# Patient Record
Sex: Female | Born: 1945 | Race: White | Hispanic: No | Marital: Married | Smoking: Never smoker
Health system: Southern US, Community
[De-identification: ages and names within clinical notes are randomized; demographics above are authoritative.]

## PROBLEM LIST (undated history)

## (undated) DIAGNOSIS — G90A Postural orthostatic tachycardia syndrome (POTS): Secondary | ICD-10-CM

## (undated) DIAGNOSIS — K648 Other hemorrhoids: Secondary | ICD-10-CM

## (undated) DIAGNOSIS — R000 Tachycardia, unspecified: Secondary | ICD-10-CM

## (undated) DIAGNOSIS — G548 Other nerve root and plexus disorders: Secondary | ICD-10-CM

## (undated) DIAGNOSIS — M797 Fibromyalgia: Secondary | ICD-10-CM

## (undated) DIAGNOSIS — K219 Gastro-esophageal reflux disease without esophagitis: Secondary | ICD-10-CM

## (undated) DIAGNOSIS — K5790 Diverticulosis of intestine, part unspecified, without perforation or abscess without bleeding: Secondary | ICD-10-CM

## (undated) DIAGNOSIS — D0510 Intraductal carcinoma in situ of unspecified breast: Secondary | ICD-10-CM

## (undated) DIAGNOSIS — I498 Other specified cardiac arrhythmias: Secondary | ICD-10-CM

## (undated) DIAGNOSIS — C801 Malignant (primary) neoplasm, unspecified: Secondary | ICD-10-CM

## (undated) DIAGNOSIS — D126 Benign neoplasm of colon, unspecified: Secondary | ICD-10-CM

## (undated) DIAGNOSIS — E785 Hyperlipidemia, unspecified: Secondary | ICD-10-CM

## (undated) DIAGNOSIS — I951 Orthostatic hypotension: Secondary | ICD-10-CM

## (undated) DIAGNOSIS — Z9889 Other specified postprocedural states: Secondary | ICD-10-CM

## (undated) DIAGNOSIS — R112 Nausea with vomiting, unspecified: Secondary | ICD-10-CM

## (undated) DIAGNOSIS — E079 Disorder of thyroid, unspecified: Secondary | ICD-10-CM

## (undated) DIAGNOSIS — G96191 Perineural cyst: Secondary | ICD-10-CM

## (undated) DIAGNOSIS — R599 Enlarged lymph nodes, unspecified: Secondary | ICD-10-CM

## (undated) HISTORY — DX: Intraductal carcinoma in situ of unspecified breast: D05.10

## (undated) HISTORY — DX: Perineural cyst: G96.191

## (undated) HISTORY — DX: Malignant (primary) neoplasm, unspecified: C80.1

## (undated) HISTORY — DX: Enlarged lymph nodes, unspecified: R59.9

## (undated) HISTORY — DX: Benign neoplasm of colon, unspecified: D12.6

## (undated) HISTORY — DX: Hyperlipidemia, unspecified: E78.5

## (undated) HISTORY — DX: Disorder of thyroid, unspecified: E07.9

## (undated) HISTORY — DX: Fibromyalgia: M79.7

## (undated) HISTORY — DX: Gastro-esophageal reflux disease without esophagitis: K21.9

## (undated) HISTORY — DX: Other nerve root and plexus disorders: G54.8

## (undated) HISTORY — PX: BREAST SURGERY: SHX581

## (undated) HISTORY — DX: Diverticulosis of intestine, part unspecified, without perforation or abscess without bleeding: K57.90

## (undated) HISTORY — DX: Other hemorrhoids: K64.8

---

## 1972-07-01 HISTORY — PX: ABDOMINAL HYSTERECTOMY: SHX81

## 1997-07-01 HISTORY — PX: BILATERAL OOPHORECTOMY: SHX1221

## 1998-05-31 HISTORY — PX: SALPINGECTOMY: SHX328

## 1999-08-07 ENCOUNTER — Ambulatory Visit (HOSPITAL_COMMUNITY): Admission: RE | Admit: 1999-08-07 | Discharge: 1999-08-07 | Payer: Self-pay | Admitting: Gastroenterology

## 2000-07-09 ENCOUNTER — Ambulatory Visit (HOSPITAL_COMMUNITY): Admission: RE | Admit: 2000-07-09 | Discharge: 2000-07-09 | Payer: Self-pay | Admitting: General Surgery

## 2000-07-09 ENCOUNTER — Encounter (INDEPENDENT_AMBULATORY_CARE_PROVIDER_SITE_OTHER): Payer: Self-pay

## 2000-07-23 ENCOUNTER — Other Ambulatory Visit: Admission: RE | Admit: 2000-07-23 | Discharge: 2000-07-23 | Payer: Self-pay | Admitting: Obstetrics and Gynecology

## 2001-08-07 ENCOUNTER — Other Ambulatory Visit: Admission: RE | Admit: 2001-08-07 | Discharge: 2001-08-07 | Payer: Self-pay | Admitting: Obstetrics and Gynecology

## 2001-09-23 ENCOUNTER — Ambulatory Visit (HOSPITAL_COMMUNITY): Admission: RE | Admit: 2001-09-23 | Discharge: 2001-09-23 | Payer: Self-pay | Admitting: Internal Medicine

## 2001-09-23 ENCOUNTER — Encounter: Payer: Self-pay | Admitting: Internal Medicine

## 2003-01-07 ENCOUNTER — Other Ambulatory Visit: Admission: RE | Admit: 2003-01-07 | Discharge: 2003-01-07 | Payer: Self-pay | Admitting: Obstetrics and Gynecology

## 2004-02-16 ENCOUNTER — Other Ambulatory Visit: Admission: RE | Admit: 2004-02-16 | Discharge: 2004-02-16 | Payer: Self-pay | Admitting: Obstetrics and Gynecology

## 2005-02-27 ENCOUNTER — Ambulatory Visit: Payer: Self-pay | Admitting: Internal Medicine

## 2005-03-18 ENCOUNTER — Ambulatory Visit: Payer: Self-pay | Admitting: Internal Medicine

## 2005-03-18 ENCOUNTER — Ambulatory Visit (HOSPITAL_COMMUNITY): Admission: RE | Admit: 2005-03-18 | Discharge: 2005-03-18 | Payer: Self-pay | Admitting: Internal Medicine

## 2005-03-18 ENCOUNTER — Encounter (INDEPENDENT_AMBULATORY_CARE_PROVIDER_SITE_OTHER): Payer: Self-pay | Admitting: Specialist

## 2005-05-29 ENCOUNTER — Other Ambulatory Visit: Admission: RE | Admit: 2005-05-29 | Discharge: 2005-05-29 | Payer: Self-pay | Admitting: Obstetrics and Gynecology

## 2005-07-24 ENCOUNTER — Encounter (HOSPITAL_COMMUNITY): Admission: RE | Admit: 2005-07-24 | Discharge: 2005-10-08 | Payer: Self-pay | Admitting: Internal Medicine

## 2005-07-25 ENCOUNTER — Ambulatory Visit (HOSPITAL_COMMUNITY): Admission: RE | Admit: 2005-07-25 | Discharge: 2005-07-25 | Payer: Self-pay | Admitting: Internal Medicine

## 2005-08-21 ENCOUNTER — Encounter: Payer: Self-pay | Admitting: *Deleted

## 2005-09-02 ENCOUNTER — Encounter: Payer: Self-pay | Admitting: Internal Medicine

## 2005-09-02 ENCOUNTER — Encounter: Payer: Self-pay | Admitting: *Deleted

## 2005-09-30 ENCOUNTER — Encounter (HOSPITAL_COMMUNITY): Admission: RE | Admit: 2005-09-30 | Discharge: 2005-12-29 | Payer: Self-pay | Admitting: Internal Medicine

## 2005-10-04 ENCOUNTER — Ambulatory Visit (HOSPITAL_COMMUNITY): Admission: RE | Admit: 2005-10-04 | Discharge: 2005-10-04 | Payer: Self-pay | Admitting: Internal Medicine

## 2005-12-05 ENCOUNTER — Ambulatory Visit (HOSPITAL_COMMUNITY): Admission: RE | Admit: 2005-12-05 | Discharge: 2005-12-05 | Payer: Self-pay | Admitting: Internal Medicine

## 2006-02-12 ENCOUNTER — Encounter: Admission: RE | Admit: 2006-02-12 | Discharge: 2006-02-12 | Payer: Self-pay | Admitting: Otolaryngology

## 2006-07-18 ENCOUNTER — Encounter: Admission: RE | Admit: 2006-07-18 | Discharge: 2006-07-18 | Payer: Self-pay | Admitting: General Surgery

## 2007-02-27 ENCOUNTER — Ambulatory Visit (HOSPITAL_COMMUNITY): Admission: RE | Admit: 2007-02-27 | Discharge: 2007-02-27 | Payer: Self-pay | Admitting: Internal Medicine

## 2007-03-18 ENCOUNTER — Ambulatory Visit: Payer: Self-pay | Admitting: Internal Medicine

## 2007-03-26 ENCOUNTER — Ambulatory Visit: Payer: Self-pay

## 2007-06-03 ENCOUNTER — Ambulatory Visit: Payer: Self-pay | Admitting: Internal Medicine

## 2007-08-07 ENCOUNTER — Encounter: Payer: Self-pay | Admitting: Internal Medicine

## 2007-12-18 ENCOUNTER — Encounter: Admission: RE | Admit: 2007-12-18 | Discharge: 2007-12-18 | Payer: Self-pay | Admitting: Internal Medicine

## 2007-12-27 IMAGING — US US SOFT TISSUE HEAD/NECK
1 series · 14 of 25 positions shown · non-contrast
Comparison: none

CLINICAL DATA: Multinodular goiter

[Series 1: unknown · 0.07mm/px · 14 of 50 slices shown]
[im 1/50]
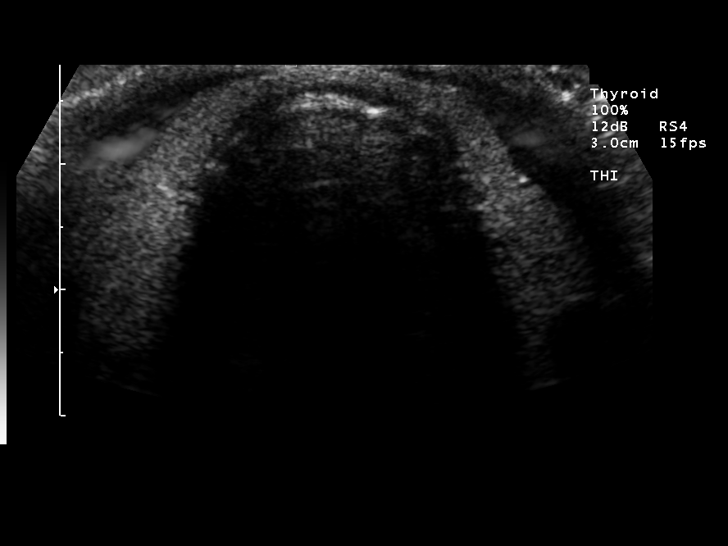
[im 5/50]
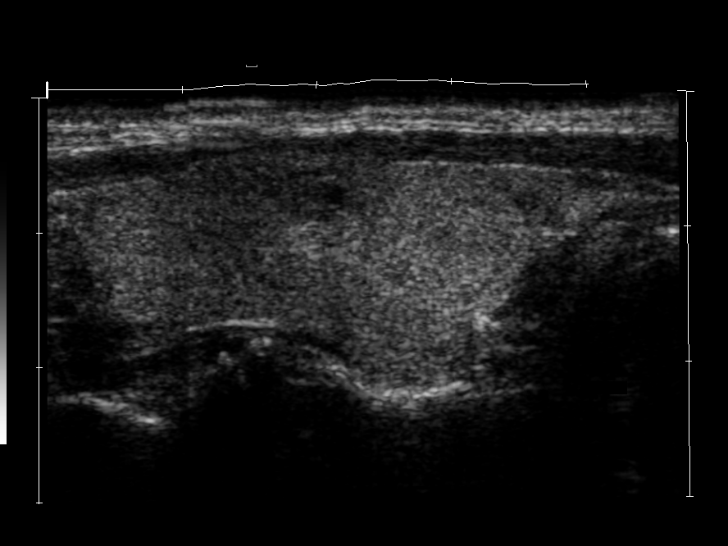
[im 9/50]
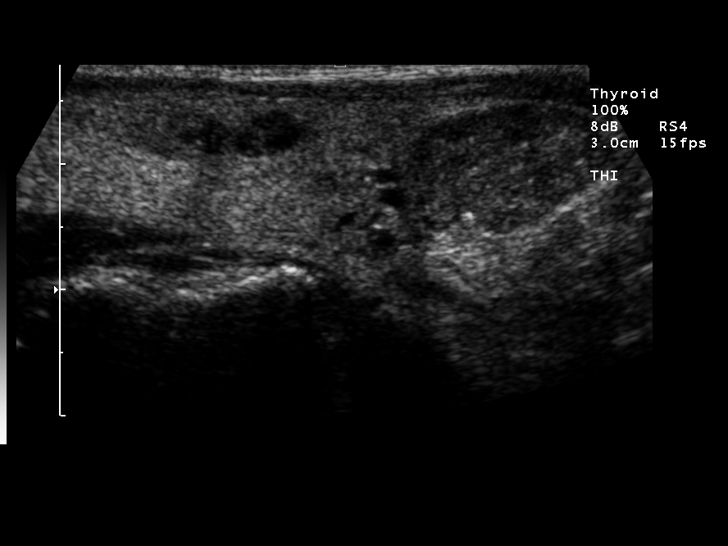
[im 13/50]
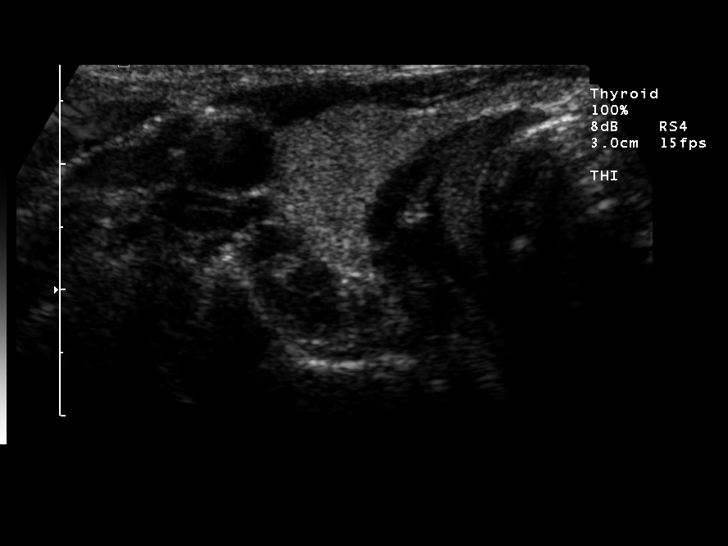
[im 17/50]
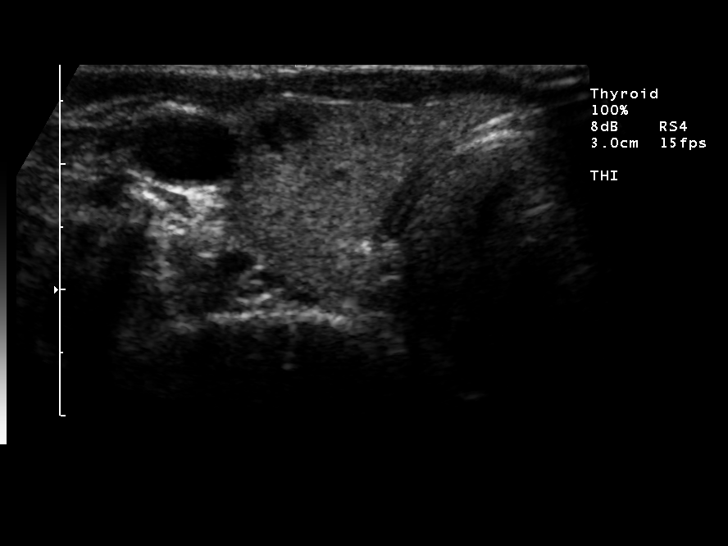
[im 19/50]
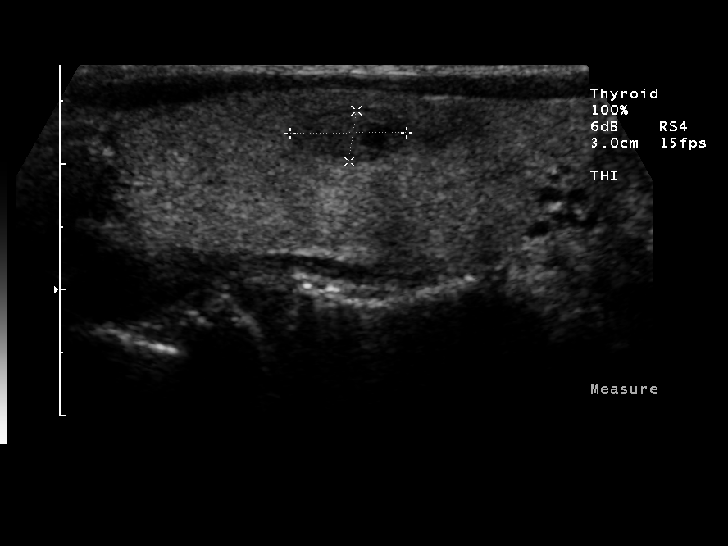
[im 23/50]
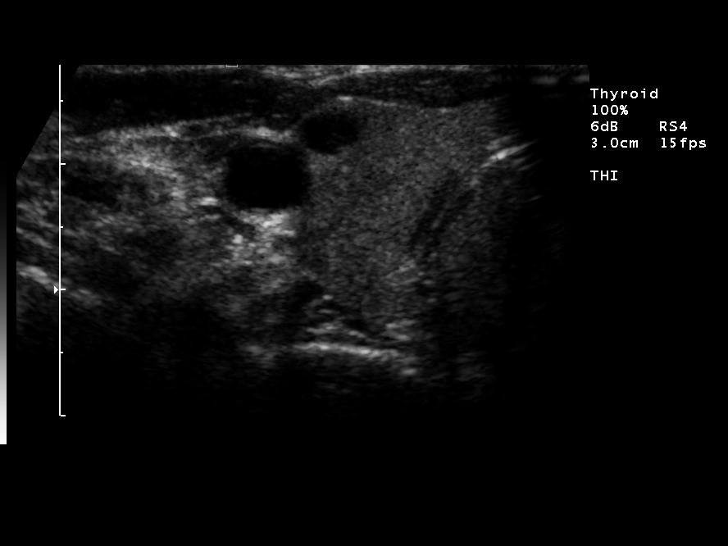
[im 27/50]
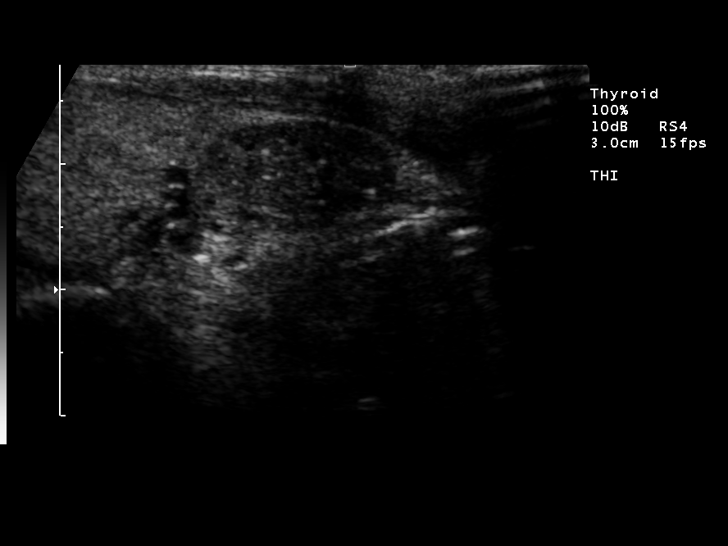
[im 31/50]
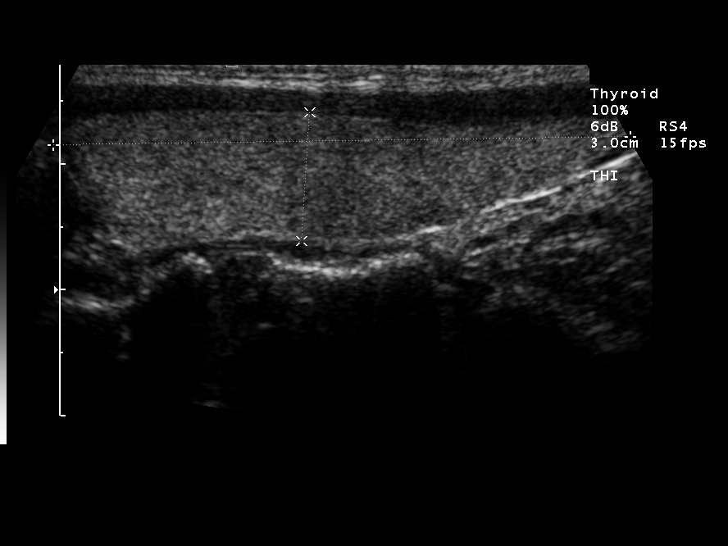
[im 33/50]
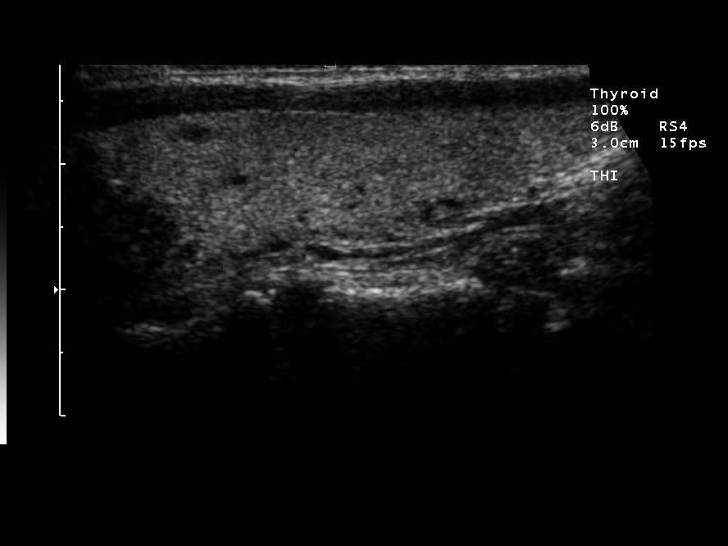
[im 37/50]
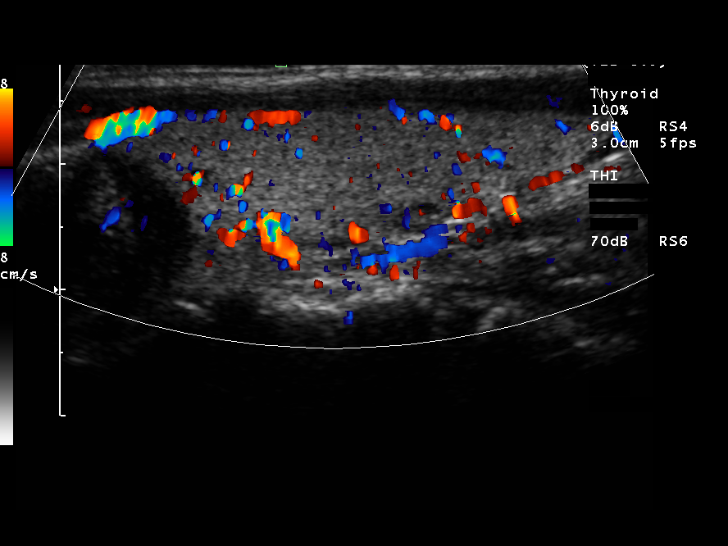
[im 41/50]
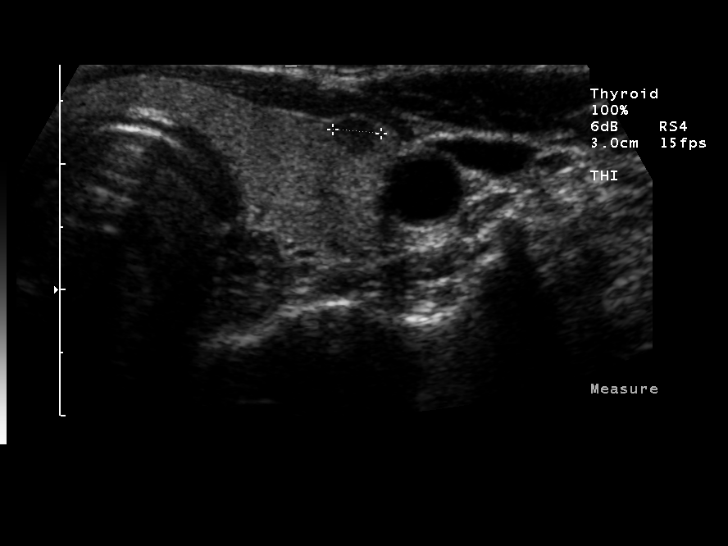
[im 45/50]
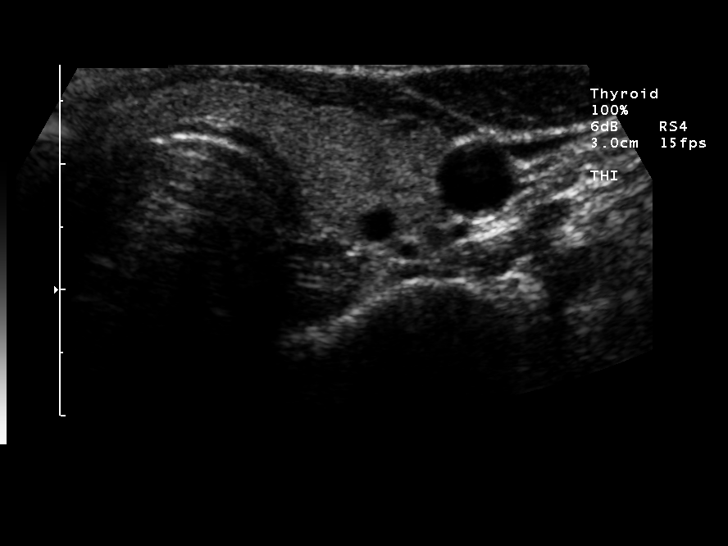
[im 50/50]
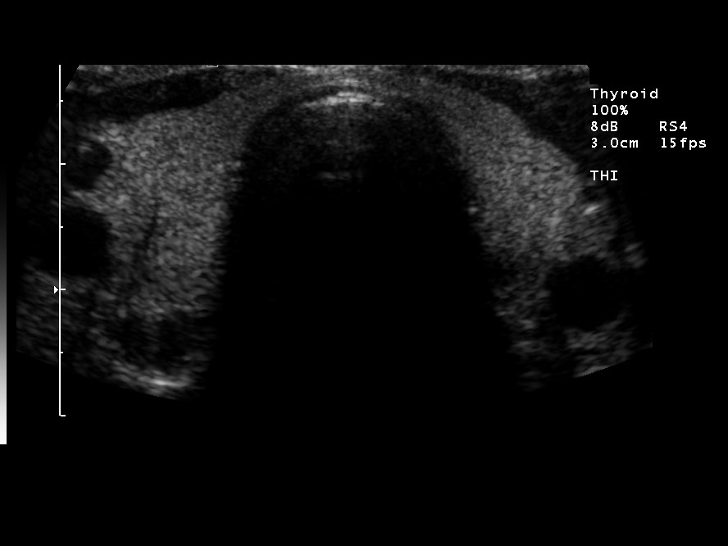

[14 of 25 positions shown; findings below may reference images not displayed]

Thyroid ultrasound:

No previous for comparison. Right lobe measures 13 x 15 x 61 mm. There is
dominant 8 x 11 x 16 mm hypoechoic nodule in the lower pole. There is a 3 x 5 x
5 mm cystic appearing lesion laterally in the midpole. There is a 4 x 6 x 9 mm
hypoechoic nodule towards the upper pole. Isthmus is unremarkable, 2.7 mm
thickness. Left lobe 10x 14 x 46 mm. 3 x 3 x 5 mm hypoechoic nodule in the
midpole. Deep 2x 3 x 3 mm cystic lesion in the inferior pole.
IMPRESSION: 1. Normal sized thyroid with several small lesions, largest 16 mm in inferior
pole right lobe

## 2007-12-31 ENCOUNTER — Encounter (INDEPENDENT_AMBULATORY_CARE_PROVIDER_SITE_OTHER): Payer: Self-pay | Admitting: Interventional Radiology

## 2007-12-31 ENCOUNTER — Other Ambulatory Visit: Admission: RE | Admit: 2007-12-31 | Discharge: 2007-12-31 | Payer: Self-pay | Admitting: Interventional Radiology

## 2007-12-31 ENCOUNTER — Encounter: Admission: RE | Admit: 2007-12-31 | Discharge: 2007-12-31 | Payer: Self-pay | Admitting: Internal Medicine

## 2008-08-26 ENCOUNTER — Encounter: Payer: Self-pay | Admitting: Internal Medicine

## 2008-09-20 ENCOUNTER — Encounter: Payer: Self-pay | Admitting: Internal Medicine

## 2008-09-23 ENCOUNTER — Encounter: Payer: Self-pay | Admitting: Internal Medicine

## 2008-09-23 DIAGNOSIS — M899 Disorder of bone, unspecified: Secondary | ICD-10-CM | POA: Insufficient documentation

## 2008-09-23 DIAGNOSIS — Z8719 Personal history of other diseases of the digestive system: Secondary | ICD-10-CM

## 2008-09-23 DIAGNOSIS — Z8679 Personal history of other diseases of the circulatory system: Secondary | ICD-10-CM | POA: Insufficient documentation

## 2008-09-23 DIAGNOSIS — M949 Disorder of cartilage, unspecified: Secondary | ICD-10-CM

## 2008-09-23 DIAGNOSIS — IMO0001 Reserved for inherently not codable concepts without codable children: Secondary | ICD-10-CM

## 2008-09-27 ENCOUNTER — Ambulatory Visit: Payer: Self-pay | Admitting: Internal Medicine

## 2008-09-27 DIAGNOSIS — K3189 Other diseases of stomach and duodenum: Secondary | ICD-10-CM | POA: Insufficient documentation

## 2008-09-27 DIAGNOSIS — K589 Irritable bowel syndrome without diarrhea: Secondary | ICD-10-CM

## 2008-09-27 DIAGNOSIS — R1013 Epigastric pain: Secondary | ICD-10-CM

## 2008-09-28 ENCOUNTER — Encounter: Payer: Self-pay | Admitting: Internal Medicine

## 2008-09-28 ENCOUNTER — Ambulatory Visit: Payer: Self-pay | Admitting: Internal Medicine

## 2008-10-01 ENCOUNTER — Encounter: Payer: Self-pay | Admitting: Internal Medicine

## 2008-10-04 ENCOUNTER — Ambulatory Visit (HOSPITAL_COMMUNITY): Admission: RE | Admit: 2008-10-04 | Discharge: 2008-10-04 | Payer: Self-pay | Admitting: Internal Medicine

## 2008-12-23 ENCOUNTER — Encounter: Admission: RE | Admit: 2008-12-23 | Discharge: 2008-12-23 | Payer: Self-pay | Admitting: Endocrinology

## 2009-03-24 ENCOUNTER — Encounter: Payer: Self-pay | Admitting: Internal Medicine

## 2009-04-13 ENCOUNTER — Ambulatory Visit: Payer: Self-pay | Admitting: Internal Medicine

## 2009-04-13 DIAGNOSIS — G4730 Sleep apnea, unspecified: Secondary | ICD-10-CM | POA: Insufficient documentation

## 2009-04-19 ENCOUNTER — Ambulatory Visit: Payer: Self-pay | Admitting: Internal Medicine

## 2009-05-22 ENCOUNTER — Ambulatory Visit: Payer: Self-pay | Admitting: Internal Medicine

## 2009-05-22 DIAGNOSIS — R079 Chest pain, unspecified: Secondary | ICD-10-CM | POA: Insufficient documentation

## 2009-05-22 DIAGNOSIS — R0789 Other chest pain: Secondary | ICD-10-CM | POA: Insufficient documentation

## 2009-05-23 ENCOUNTER — Telehealth: Payer: Self-pay | Admitting: Internal Medicine

## 2009-05-24 ENCOUNTER — Telehealth (INDEPENDENT_AMBULATORY_CARE_PROVIDER_SITE_OTHER): Payer: Self-pay | Admitting: *Deleted

## 2009-05-29 ENCOUNTER — Ambulatory Visit: Payer: Self-pay | Admitting: Cardiology

## 2009-05-29 ENCOUNTER — Encounter (HOSPITAL_COMMUNITY): Admission: RE | Admit: 2009-05-29 | Discharge: 2009-06-29 | Payer: Self-pay | Admitting: Internal Medicine

## 2009-05-29 ENCOUNTER — Ambulatory Visit: Payer: Self-pay

## 2009-07-25 ENCOUNTER — Encounter: Payer: Self-pay | Admitting: Internal Medicine

## 2009-07-26 ENCOUNTER — Ambulatory Visit: Payer: Self-pay | Admitting: Internal Medicine

## 2009-08-07 ENCOUNTER — Telehealth (INDEPENDENT_AMBULATORY_CARE_PROVIDER_SITE_OTHER): Payer: Self-pay | Admitting: *Deleted

## 2010-01-11 ENCOUNTER — Encounter: Admission: RE | Admit: 2010-01-11 | Discharge: 2010-01-11 | Payer: Self-pay | Admitting: Obstetrics and Gynecology

## 2010-07-22 ENCOUNTER — Encounter: Payer: Self-pay | Admitting: Internal Medicine

## 2010-08-02 NOTE — Miscellaneous (Signed)
  Clinical Lists Changes  Observations: Added new observation of NUCLEAR NOS: Exercise Capacity: Good exercise capacity. BP Response: Normal blood pressure response. Clinical Symptoms: The exercise was limited by shortness of breath ECG Impression: No significant ST segment change suggestive of ischemia. Overall Impression: Normal stress nuclear study.  (05/29/2009 9:59)      Nuclear Study  Procedure date:  05/29/2009  Findings:      Exercise Capacity: Good exercise capacity. BP Response: Normal blood pressure response. Clinical Symptoms: The exercise was limited by shortness of breath ECG Impression: No significant ST segment change suggestive of ischemia. Overall Impression: Normal stress nuclear study.

## 2010-08-02 NOTE — Progress Notes (Signed)
  Request from [employer] recieved forwarded to [facility]  August 07, 2009 2:14 PM

## 2010-08-02 NOTE — Assessment & Plan Note (Signed)
Summary: 6 WK F/U   Visit Type:  Follow-up Primary Provider:  Martha Clan MD   History of Present Illness: The patient presents today for routine electrophysiology followup. She reports doing very well since last being seen in our clinic. Her palpitations have improved with nadolol.  Her chest pain has resolved.  The patient denies symptoms of shortness of breath, orthopnea, PND, lower extremity edema, dizziness, presyncope, syncope, or neurologic sequela. The patient is tolerating medications without difficulties and is otherwise without complaint today.   Current Medications (verified): 1)  Estrace 0.5 Mg Tabs (Estradiol) .Marland Kitchen.. 1 By Mouth Once Daily 2)  Synthroid 88 Mcg Tabs (Levothyroxine Sodium) .Marland Kitchen.. 1 By Mouth Once Daily X 6 Days, Then Take 1 1/2 By Mouth 1 Day(Saturday) 3)  Ambien 10 Mg Tabs (Zolpidem Tartrate) .Marland Kitchen.. 1 By Mouth At Bedtime 4)  Xanax 1 Mg Tabs (Alprazolam) .... Take 1 By Mouth As Needed 5)  Krill Oil 1000 Mg Caps (Krill Oil) .... 3 Times  A Week 6)  Aspirin 81 Mg Tbec (Aspirin) .... Take One Tablet By Mouth (3 Times A Week) 7)  Omeprazole 20 Mg Cpdr (Omeprazole) .... Take 1 As Needed 8)  Nadolol 20 Mg Tabs (Nadolol) .... 1/2 Tab Qd  Allergies: 1)  ! * Antidepressants 2)  ! * Atarax 3)  ! Celexa 4)  ! Zoloft 5)  ! Darvocet 6)  ! * Yellow Dye #5 7)  ! * Bextra 8)  ! Fosamax 9)  ! * Zirtec 10)  ! Flexeril 11)  ! Naprosyn  Past History:  Past Medical History: Reviewed history from 04/13/2009 and no changes required. FIBROMYALGIA (ICD-729.1) OSTEOPENIA (ICD-733.90) PALPITATIONS  GASTRITIS  Hypertension Ventricular Pre-excitation  Past Surgical History: Reviewed history from 04/13/2009 and no changes required. hemorrhoidectomy Hysterectomy-1974 for bleeding SBO-1999  Social History: Reviewed history from 04/13/2009 and no changes required. Pt lives in [place] with her husband.  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 Alcohol Use -  no Illicit Drug Use - no Patient has never smoked.  Patient does not get regular exercise.   Review of Systems       All systems are reviewed and negative except as listed in the HPI.   Vital Signs:  Patient profile:   65 year old female Height:      65 inches Weight:      128 pounds BMI:     21.38 Pulse rate:   64 / minute BP sitting:   120 / 88  (left arm)  Vitals Entered By: Laurance Flatten CMA (July 26, 2009 11:05 AM)  Physical Exam  General:  Well developed, well nourished, in no acute distress. Head:  normocephalic and atraumatic Eyes:  PERRLA/EOM intact; conjunctiva and lids normal. Nose:  no deformity, discharge, inflammation, or lesions Mouth:  Teeth, gums and palate normal. Oral mucosa normal. Neck:  Neck supple, no JVD. No masses, thyromegaly or abnormal cervical nodes. Lungs:  clear Heart:  Non-displaced PMI, chest non-tender; regular rate and rhythm, S1, S2 without murmurs, rubs or gallops. Carotid upstroke normal, no bruit. Normal abdominal aortic size, no bruits. Femorals normal pulses, no bruits. Pedals normal pulses. No edema, no varicosities. Abdomen:  Bowel sounds positive; abdomen soft and non-tender without masses, organomegaly, or hernias noted. No hepatosplenomegaly. Msk:  Back normal, normal gait. Muscle strength and tone normal. Pulses:  pulses normal in all 4 extremities Extremities:  No clubbing or cyanosis. Neurologic:  Alert and oriented x 3.  strength/sensation intact Skin:  Intact without lesions or rashes. Cervical Nodes:  no significant adenopathy Psych:  Normal affect.   Impression & Recommendations:  Problem # 1:  CHEST PAIN, ATYPICAL (ICD-786.59) resolved recent nuclear study was normal no further testing at this time.  Problem # 2:  PALPITATIONS, HX OF (ICD-V12.50) improved with nadolol prior event monitor documented only sinus rhythm during sypmtoms of "palpitations" no further testing planned at this point  Patient  Instructions: 1)  return in 6 months

## 2010-11-13 NOTE — Letter (Signed)
March 18, 2007    [name], M.D.  [address]  [place], [place] [address]   RE:  Tiffany, Clarke  MRN:  [id number]  /  DOB:  1945-09-28   Dear [name],   It was a pleasure to see [name] today at your request. As you  know she is a 65 year old lady with a 20 year history of palpitations or  so, which have become markedly more problematic in the last year and you  and [name] identified by electrocardiogram the presence of  ventricular preexcitation.   Interestingly her history is quite unusual. When I asked her how these  episodes have been going on she says about a year. When I asked her how  long the longest one goes on she says about a month.   It turns out her palpitations are primarily orthostatic. She also has  palpitations in showers that are hot, with prolonged standing, and in  the heat.   She describes her palpitations as being relatively abrupt in onset but  relatively gradual in offset. She describes her pulses being in the 90s  to 130 range as her rapid heart beat.   Her diet is quite replete of salt. It is quite replete of fluid.   Interestingly she has sisters who also have tachy palpitations.   Her cardiac evaluation here thus far has included a Myoview from a year  ago that was non ischemic, an echo that was normal.   There was a Holter monitor also that had heart rates ranging from 50 to  130. She said that this was not associated with symptoms. There were  rare ectopic beats.   She has had problems with constipation. She has significant problems  with dry mouth and dry eyes. She has not had complaints of a dry vagina.  She has not had problems with difficultly with starting or stopping her  urine stream. She has noted change in the texture of her hair.   PAST MEDICAL HISTORY:  In addition to the above is notable for treated  hypothyroidism to which she has attributed some of the above changes.   PAST SURGICAL HISTORY:  Notable  for oophorectomy and a hysterectomy.   CURRENT MEDICATIONS:  Include:  1. Estrace.  2. Synthroid 100 mcg a day.  3. Ambien 10.  4. Toprol 25 p.r.n. Higher doses of Toprol have been associated with      significant hypotension.   ALLERGIES:  INCLUDE: ALEVE, NAPROSYN, SODIUM, ZYRTEC.   SOCIAL HISTORY:  She is married. She has 2 children, 5 grandchildren.  She does not use cigarettes, alcohol, or recreational drugs. She works  as a [name] at [employer] and has for 15 years, 15 years too  long.   PHYSICAL EXAMINATION:  She is a middle to older Caucasian female  appearing her stated age of 61. Her blood pressure was 126/72, with  pulse of 67 lying, sitting is was 137/89 with a pulse of 77 and her peak  heart rate was 88 with a blood pressure of 138/93.  HEENT EXAM: Demonstrated __________ . The neck veins were flat. The  carotids were brisk and full bilaterally without bruits.  BACK: Without kyphosis or scoliosis.  LUNGS: Clear.  HEART SOUNDS: Regular with a loud S1.  ABDOMEN: Soft with active bowel sounds, there was a broad midline  pulsation.  Femoral pulses were 2 +, distal pulses were intact. There was no  clubbing, cyanosis, or edema.  NEUROLOGICAL EXAM:  Grossly normal.  SKIN: Warm and dry.   Electrocardiogram dated today demonstrated sinus rhythm at 74 with  intervals of 0.11/0.11/0.38, the axis was 55 degrees. There was evidence  of ventricular preexcitation with a prominent negative delta wave in  lead V1. The delta waves were upright in leads III, III, and F, as well  as in lead 1 consistent with a right-sided accessory pathway.   Electrocardiogram from your office demonstrated sinus rhythm without  evidence of ventricular preexcitation.   IMPRESSION:  1. Electrocardiogram consistent with ventricular preexcitation.  2. Palpitations that I do not think are consistent with accessory      pathway function but rather __________  3. Postural orthostatic tachycardia  syndrome.  4. Hypotension with beta blockers.  5. Broad abdominal aneurysm with a strong family history of ruptured      aortic aneurysms.   [name], [name] has a strikingly abnormal electrocardiogram with her  preexcitation. However, her symptoms do not sound like that, they sound  more like a dysautonomic process. An event recorder might be useful to  try to clarify this.   It is may be a little bit of a challenge to treat dysautonomia; I should  mention that I gave her the possibles from [name]. The reason is that she  has modest diastatic hypertension and has not been able to tolerate the  beta blockers. I think possibly trying Florinef might be of value for  her.   I meant to schedule her for an abdominal aortic ultrasound but she got  out of the office with me having forgotten to do that she we will need  to coordinate that with her.   In the event that event recording demonstrates that she has evidence of  reciprocating tachycardia, catheter ablation would certainly be the  right next thing to do given her low blood pressure and probable  intolerance of AV nodal blocking agents.   [name] thanks very much for asking [place] to see her.    Sincerely,      Tiffany Salvia, MD, [facility]  Electronically Signed    SCK/MedQ  DD: 03/18/2007  DT: 03/19/2007  Job #: [id number]   CC:    [name], M.D.

## 2010-11-13 NOTE — Letter (Signed)
June 03, 2007    [name], M.D.  [address]  [place], [place] [address]   RE:  [name], [name]  MRN:  [id number]  /  DOB:  04-21-1946   Dear Aurther Loft:   [name] came back in today with multiple complaints.  She is to  see Eric Form tomorrow.  She has complaints of palpitations which are  aggravated mostly by changes in position.  She has been having shortness  of breath and chest pain typically provoked by squatting.  It can last  an hour or two.  It is not necessarily related to exertion.  She has  also had problems with progressive anorexia, weight loss accompanied by  a burning in her throat and her mouth.   Her current medications include Estrace, Ambien, Synthroid 100, and  Toprol-XL 12.5 b.i.d.   On examination today, her blood pressure is 140/90 initially with a  pulse of 88.  During orthostatics she again had a significant change in  her heart rate from 65 to 93 and a change in her blood pressure from  133/79 to 114/82 going from sitting to standing.  Overall, her blood  pressure remained stable, though.  Her lungs were clear, her heart  sounds were regular, the extremities were without edema.  Her weight was  118, down a couple of pounds from September.   IMPRESSION:  1. Ventricular preexcitation.  2. Palpitations, probably unrelated to the above, more consistent with      postural orthostatic tachycardia syndrome.  3. Noncardiac chest pain (negative Myoview and normal echocardiogram      within the year).  4. Anorexia and oropharyngeal burning, question gastroesophageal      reflux.   [name], [name], while having ventricular preexcitation I do not  think is having arrhythmias related to this.  I have encouraged her to  maintain her fluid intake given her postural orthostatic tachycardia.  I  am a little bit reluctant to add medication, although a little bit more  beta blocker might help her with this.   I have asked that she follow up  with you and Eric Form, and if there is  anything I can do further about her arrhythmias, please do not hesitate  to contact me.    Sincerely,      Duke Salvia, MD, [facility]  Electronically Signed    SCK/MedQ  DD: 06/03/2007  DT: 06/03/2007  Job #: [id number]   CC:    W. Buren Kos, M.D.

## 2010-11-16 NOTE — Op Note (Signed)
[facility]  Patient:    [name], [name]                     MRN: [id number] Proc. Date: 07/09/00 Adm. Date:  [id number] Attending:  Chevis Pretty S                           Operative Report  PREOPERATIVE DIAGNOSIS:  Hemorrhoids and prominent anal papilla.  POSTOPERATIVE DIAGNOSIS:  Hemorrhoids and prominent anal papilla.  PROCEDURE PERFORMED:  Hemorrhoidectomy and anorectal biopsy.  SURGEON:  Chevis Pretty, M.D.  ANESTHESIA:  General endotracheal.  DESCRIPTION OF PROCEDURE:  After informed consent was obtained, the patient was brought to the operating room and placed in the supine position on the operating table.  After the adequate induction of general anesthesia, the patient was placed in the lithotomy position, and her perineum and rectal area were prepped with Betadine and draped in the usual sterile manner.  A bullet type retractor was placed within the anal canal and inspection revealed some mild to moderately prominent comminution of internal and external hemorrhoid tissue as well as several prominent anal papilla posteriorly.  An area on the left posterolateral wall was chosen where there was some hemorrhoidal tissue in conjunction with a prominent anal papilla, and this area was infiltrated with 0.25% Marcaine with epinephrine and this was massaged into the tissues for several minutes.  Next, a pair of Metzenbaum scissors were used to excise this area of tissue including the hemorrhoidal tissue as well as anal papilla taking care to avoid any damage to the underlying sphincter muscles.  Once this was complete, the specimen was oriented and sent to pathology fresh for examination.  Bovie electrocautery was used to cauterize several bleeding areas within the operative site until the wound was hemostatic.  The inner three quarters of the wound was then closed with a running 2-0 chromic suture.  One quarter of the external most portion of the wound  was left open for drainage purposes.  The wound was again examined and found to be hemostatic. A Gelfoam with lidocaine jelly was placed in the anal canal and dressing of gauze sponges was placed over the rectum.  The patient tolerated the procedure well.  At the end of the case, all needle, sponge, and instrument counts were correct.  The patient was awakened and taken to the recovery room in stable condition. DD:  07/09/00 TD:  07/09/00 Job: [id number]

## 2011-03-20 ENCOUNTER — Other Ambulatory Visit: Payer: Self-pay | Admitting: Obstetrics and Gynecology

## 2011-03-20 DIAGNOSIS — R928 Other abnormal and inconclusive findings on diagnostic imaging of breast: Secondary | ICD-10-CM

## 2011-04-01 ENCOUNTER — Ambulatory Visit
Admission: RE | Admit: 2011-04-01 | Discharge: 2011-04-01 | Disposition: A | Discharge status: Home / Self Care | Payer: BC Managed Care – PPO | Source: Ambulatory Visit | Attending: Obstetrics and Gynecology | Admitting: Obstetrics and Gynecology

## 2011-04-01 DIAGNOSIS — R928 Other abnormal and inconclusive findings on diagnostic imaging of breast: Secondary | ICD-10-CM

## 2011-04-15 ENCOUNTER — Ambulatory Visit (INDEPENDENT_AMBULATORY_CARE_PROVIDER_SITE_OTHER): Payer: BC Managed Care – PPO | Admitting: General Surgery

## 2011-04-15 ENCOUNTER — Encounter (INDEPENDENT_AMBULATORY_CARE_PROVIDER_SITE_OTHER): Payer: Self-pay | Admitting: General Surgery

## 2011-04-15 ENCOUNTER — Other Ambulatory Visit (INDEPENDENT_AMBULATORY_CARE_PROVIDER_SITE_OTHER): Payer: Self-pay | Admitting: General Surgery

## 2011-04-15 DIAGNOSIS — R928 Other abnormal and inconclusive findings on diagnostic imaging of breast: Secondary | ICD-10-CM

## 2011-04-15 DIAGNOSIS — R921 Mammographic calcification found on diagnostic imaging of breast: Secondary | ICD-10-CM

## 2011-04-15 NOTE — Patient Instructions (Signed)
Breast Problems and Self Exam Completing monthly breast exams may pick up problems early and save lives. There can be numerous causes of swelling, tenderness or lumps in the breasts. Some of these causes are:   Fibrocystic breast syndrome (noncancerous lumps). This is the most common cause of lumps in the breast.   Fibroadenoma breast tumors of unknown cause. These are noncancerous (benign) lumps.   Benign fatty tumors (lipomas).   Cancer of the breast.  By doing monthly breast exams, you get to know how your breasts feel and how they can change from month to month. This allows you to notice changes early. It can also offer you some reassurance that your breast health is good.  BREAST SELF EXAM There are a few points to follow when doing a thorough breast exam. The best time to examine your breasts is 5 to 7 days after your menstrual period is over. During menstruation, the breasts are lumpier, and it may be more difficult to pick up changes. If you do not menstruate, have reached menopause, or had a hysterectomy (uterus removal), examine your breasts the first day of every month. After 3 to 4 months, you will become more familiar with the variations of your breasts and more comfortable with the exam.  Perform your breast exam monthly. Keep a written record with breast changes or normal findings for each breast. This makes it easier to be sure of changes, so you do not need to depend only on memory for size, tenderness, or location. Try to do the exam at the same time each month, and write down where you are in your menstrual cycle, if you are still menstruating.   Look at your breasts. Stand in front of a mirror with your hands clasped behind your head. Tighten your chest muscles and look for asymmetry. This means a difference in shape or contour from one breast to the other, such as puckers, dips or bumps. Also, look for skin changes.    Lean forward with your hands on your hips. Again, look for  symmetry and skin changes.   While showering, soap the breasts. Then, carefully feel the breasts with your fingertips, while holding the other arm (on the side of the breast you are examining) over your head. Do this with each breast, carefully feeling for lumps or changes. Typically, a circular motion with moderate fingertip pressure should be used.    Repeat this exam while lying on your back. Put your arm behind your head and a pillow under your shoulders. Again, use your fingertips to examine both breasts, feeling for lumps and thickening. Begin at the top of your breast, and go clockwise around the whole breast.   At the end of your exam, gently squeeze each nipple to see if there is any drainage of fluids. Look for nipple changes, dimpling, or redness.   Lastly, examine the upper chest and collarbone (clavicle) areas, and in your armpits.  It is not necessary to be alarmed if you find a breast lump. Most of them are not cancerous. However, it is necessary to see your caregiver if a lump is found, in order to have it looked at. Document Released: 06/17/2005 Document Re-Released: 07/09/2009 [employer] Patient Information 2011 [employer]. 

## 2011-04-15 NOTE — Progress Notes (Signed)
Chief Complaint  Patient presents with  . Other    Eval of left breast calcifications    HPI [name] is a 65 y.o. female.  Referred by Dr. Baird Clarke  HPI This is a 65 year old female who has had a history of some left breast microcalcifications which had been followed. She has no complaints referable to her breasts including any nipple discharge, mass, and/or tenderness. She has no real prior breast history at all either. She has a family history for breast cancer in great-grandmother. This year's mammogram showed some increasing pleomorphic calcifications in the left posterior outer breast. These are not amenable to percutaneous biopsy due to location she was then referred for excisional biopsy.  Past Medical History  Diagnosis Date  . GERD (gastroesophageal reflux disease)   . Osteoporosis   . Thyroid disease   . Chills   . Palpitations   . Constipation   . Enlarged lymph nodes   . Fibromyalgia     Past Surgical History  Procedure Date  . Abdominal hysterectomy 1974  . Bilateral oophorectomy 1999    Family History  Problem Relation Age of Onset  . Cancer Mother   . Cancer Father     colon  . Cancer Paternal Grandmother     breast    Social History History  Substance Use Topics  . Smoking status: Never Smoker   . Smokeless tobacco: Never Used  . Alcohol Use: No    Allergies  Allergen Reactions  . Cyclobenzaprine Hcl Palpitations  . Sertraline Hcl Palpitations and Other (See Comments)    Causes visual sensitivity (especially to particular colors)  . Citalopram Hydrobromide   . Propoxyphene N-Acetaminophen   . Alendronate Sodium Nausea And Vomiting  . Naproxen Hives, Diarrhea and Nausea And Vomiting    Current Outpatient Prescriptions  Medication Sig Dispense Refill  . levothyroxine (SYNTHROID, LEVOTHROID) 88 MCG tablet Take 88 mcg by mouth daily.        . nadolol (CORGARD) 20 MG tablet Take 20 mg by mouth daily. Patient takes 1/2 to 1 pill per  day       . zolpidem (AMBIEN) 10 MG tablet Take 10 mg by mouth at bedtime as needed.          Review of Systems Review of Systems  Constitutional: Positive for chills.  Cardiovascular: Positive for palpitations.  Gastrointestinal: Positive for constipation.  All other systems reviewed and are negative.    Blood pressure 126/84, pulse 64, temperature 97.2 F (36.2 C), temperature source Temporal, resp. rate 14, height 5' 4.5" (1.638 m), weight 129 lb 6.4 oz (58.695 kg).  Physical Exam Physical Exam  Constitutional: She appears well-developed and well-nourished.  Eyes: No scleral icterus.  Neck: Neck supple.  Cardiovascular: Normal rate, regular rhythm and normal heart sounds.   Pulmonary/Chest: Effort normal and breath sounds normal. She has no wheezes. She has no rales. Right breast exhibits no inverted nipple, no mass, no nipple discharge, no skin change and no tenderness. Left breast exhibits no inverted nipple, no mass, no nipple discharge, no skin change and no tenderness. Breasts are symmetrical.  Lymphadenopathy:    She has cervical adenopathy (mild bilateral).    She has no axillary adenopathy.       Right: No supraclavicular adenopathy present.       Left: No supraclavicular adenopathy present.    Data Reviewed *RADIOLOGY REPORT*  Clinical Data: Abnormal left screening mammogram  DIGITAL DIAGNOSTIC LEFT MAMMOGRAM  Comparison: With priors  Findings:  Magnification views of the far posterior lower outer  quadrant of the left breast were performed. There is a cluster of  developing calcifications occupying an area measuring 1.9 x 0.8 x  1.8 cm. The calcifications are pleomorphic and vary in size and  shape. They are concerning for DCIS. There is no associated mass.  IMPRESSION:  Suspicious left breast calcifications. A stereotactic biopsy was  attempted but unsuccessful secondary to the far posterior location  of the calcifications. Surgical excision is recommended.  Surgical  consultation has been scheduled with Dr. Dwain Clarke at the patient's  convenience.  BI-RADS CATEGORY 4: Suspicious abnormality - biopsy should be  considered.    Assessment    Bi-rads 4 mmg with calcifications not amenable to stereotactic biopsy    Plan    We discussed at the radiologist cannot percutaneously biopsied these calcifications. They need to be biopsied or excised due to the fact there is a concern for ductal carcinoma in situ. We discussed a wire-guided biopsy. We discussed the risks including bleeding, infection, and further surgery for breast cancer is present. She understands this and I will schedule her for soon as possible. I told her that she needs to bring her work paperwork and I will give her one week off of work and then she can return but no restrictions.       [name] 04/15/2011, 10:00 AM

## 2011-04-22 ENCOUNTER — Other Ambulatory Visit (INDEPENDENT_AMBULATORY_CARE_PROVIDER_SITE_OTHER): Payer: Self-pay | Admitting: General Surgery

## 2011-04-22 ENCOUNTER — Ambulatory Visit
Admission: RE | Admit: 2011-04-22 | Discharge: 2011-04-22 | Disposition: A | Discharge status: Home / Self Care | Payer: BC Managed Care – PPO | Source: Ambulatory Visit | Attending: General Surgery | Admitting: General Surgery

## 2011-04-22 ENCOUNTER — Inpatient Hospital Stay (HOSPITAL_BASED_OUTPATIENT_CLINIC_OR_DEPARTMENT_OTHER)
Admission: RE | Admit: 2011-04-22 | Discharge: 2011-04-22 | Disposition: A | Discharge status: Home / Self Care | Payer: BC Managed Care – PPO | Source: Ambulatory Visit | Attending: General Surgery | Admitting: General Surgery

## 2011-04-22 DIAGNOSIS — Z01811 Encounter for preprocedural respiratory examination: Secondary | ICD-10-CM

## 2011-04-22 LAB — CBC
MCH: 31 pg (ref 26.0–34.0)
MCV: 92.1 fL (ref 78.0–100.0)
RBC: 4.45 MIL/uL (ref 3.87–5.11)
RDW: 13 % (ref 11.5–15.5)

## 2011-04-22 LAB — BASIC METABOLIC PANEL
BUN: 17 mg/dL (ref 6–23)
Calcium: 9.5 mg/dL (ref 8.4–10.5)
Chloride: 105 mEq/L (ref 96–112)
Creatinine, Ser: 0.88 mg/dL (ref 0.50–1.10)
GFR calc Af Amer: 79 mL/min — ABNORMAL LOW (ref >90)
GFR calc non Af Amer: 68 mL/min — ABNORMAL LOW (ref >90)

## 2011-04-22 LAB — DIFFERENTIAL
Basophils Absolute: 0 10*3/uL (ref 0.0–0.1)
Basophils Relative: 0 % (ref 0–1)
Eosinophils Absolute: 0.3 10*3/uL (ref 0.0–0.7)
Neutro Abs: 4.6 10*3/uL (ref 1.7–7.7)

## 2011-04-24 ENCOUNTER — Other Ambulatory Visit (INDEPENDENT_AMBULATORY_CARE_PROVIDER_SITE_OTHER): Payer: Self-pay | Admitting: General Surgery

## 2011-04-24 ENCOUNTER — Ambulatory Visit (HOSPITAL_BASED_OUTPATIENT_CLINIC_OR_DEPARTMENT_OTHER)
Admission: RE | Admit: 2011-04-24 | Discharge: 2011-04-24 | Disposition: A | Discharge status: Home / Self Care | Payer: BC Managed Care – PPO | Source: Ambulatory Visit | Attending: General Surgery | Admitting: General Surgery

## 2011-04-24 ENCOUNTER — Ambulatory Visit
Admission: RE | Admit: 2011-04-24 | Discharge: 2011-04-24 | Disposition: A | Discharge status: Home / Self Care | Payer: BC Managed Care – PPO | Source: Ambulatory Visit | Attending: General Surgery | Admitting: General Surgery

## 2011-04-24 DIAGNOSIS — R921 Mammographic calcification found on diagnostic imaging of breast: Secondary | ICD-10-CM

## 2011-04-24 DIAGNOSIS — IMO0001 Reserved for inherently not codable concepts without codable children: Secondary | ICD-10-CM | POA: Insufficient documentation

## 2011-04-24 DIAGNOSIS — K219 Gastro-esophageal reflux disease without esophagitis: Secondary | ICD-10-CM | POA: Insufficient documentation

## 2011-04-24 DIAGNOSIS — D0590 Unspecified type of carcinoma in situ of unspecified breast: Secondary | ICD-10-CM | POA: Insufficient documentation

## 2011-04-24 DIAGNOSIS — I10 Essential (primary) hypertension: Secondary | ICD-10-CM | POA: Insufficient documentation

## 2011-04-24 HISTORY — PX: BREAST BIOPSY: SHX20

## 2011-04-24 NOTE — Op Note (Signed)
  NAMELATORI, BEGGS NO.:  [id number]  MEDICAL RECORD NO.:  [id number]  LOCATION:                                 FACILITY:  PHYSICIAN:  Juanetta Gosling, MDDATE OF BIRTH:  11-Oct-1945  DATE OF PROCEDURE:  04/24/2011 DATE OF DISCHARGE:                              OPERATIVE REPORT   PREOPERATIVE DIAGNOSIS:  Left breast calcifications unable to undergo stereotactic biopsy.  POSTOPERATIVE DIAGNOSIS:  Left breast calcifications unable to undergo stereotactic biopsy.  PROCEDURE:  Left breast wire localization biopsy.  SURGEON:  Juanetta Gosling, MD  ASSISTANT:  None.  ANESTHESIA:  General.  SUPERVISING ANESTHESIOLOGIST:  Germaine Pomfret, M.D.  SPECIMENS:  Left breast marked long lateral, short superior double stitch deep.  DISPOSITION:  Specimen to pathology.  ESTIMATED BLOOD LOSS:  Minimal.  COMPLICATIONS:  None.  DRAINS:  None.  DISPOSITION:  Patient to recovery in stable condition.  INDICATIONS:  This is a 65 year old female underwent a regular routine screening mammogram of left breast microcalcifications.  These are unable to be biopsied stereotactically, so she was referred for wire localization biopsy.  She and I discussed the risks and benefits of that prior to beginning.  PROCEDURE:  After informed consent was obtained, the patient was first taken to breast center, she had a wire placed by Dr. Si Gaul.  I had these films available for my review in the operating room.  The wire was around the larger area of calcifications that went posterior, I was unable to be bracketed due to the very posterior location but I was able to view these on the mammogram.  She was taken to the operating room. She had sequential compression devices were placed on lower extremities. She was administered 1 g of intravenous cefazolin.  She was placed under general anesthesia without complication with an LMA.  She was then prepped and draped in standard  sterile surgical fashion.  Surgical time- out was then performed.  I made a curvilinear incision overlying the calcifications.  I used the cautery and I dissected down.  I pulled the wire in remotely.  I then removed the wire as well as the lesion all the way down to the pectoralis muscle including the pectoralis fascia.  This was then marked as above.  This was passed off the table.  Faxitron mammogram appeared to confirm removal of these areas, this was confirmed by Dr. Si Gaul. Hemostasis was then obtained.  I then closed this with 2-0 Vicryl, 3-0 Vicryl, 4-0 Monocryl.  Steri-Strips and sterile dressing were placed.  I infiltrated 10 mL of 0.25% Marcaine throughout the wound.  She tolerated this well, was extubated and transferred to the recovery room in stable condition.     Juanetta Gosling, MD     MCW/MEDQ  D:  04/24/2011  T:  04/24/2011  Job:  [id number]  cc:   Kari Baars, M.D. Guy Sandifer Henderson Cloud, M.D. Rosendo Gros, MD  Electronically Signed by Emelia Loron MD on 04/24/2011 03:46:01 PM

## 2011-04-30 ENCOUNTER — Other Ambulatory Visit (INDEPENDENT_AMBULATORY_CARE_PROVIDER_SITE_OTHER): Payer: Self-pay | Admitting: General Surgery

## 2011-04-30 DIAGNOSIS — D0590 Unspecified type of carcinoma in situ of unspecified breast: Secondary | ICD-10-CM

## 2011-05-01 ENCOUNTER — Telehealth (INDEPENDENT_AMBULATORY_CARE_PROVIDER_SITE_OTHER): Payer: Self-pay

## 2011-05-01 DIAGNOSIS — D0510 Intraductal carcinoma in situ of unspecified breast: Secondary | ICD-10-CM

## 2011-05-01 NOTE — Telephone Encounter (Signed)
Olegario Messier with [facility] calling to ask if we would change the MRI orders on pt to MR Breast Bilateral with and without contrast. Also,please chang the location from [facility] to [facility]. I told Olegario Messier that I would cancel the first order and restart a new one.[facility]

## 2011-05-05 ENCOUNTER — Ambulatory Visit
Admission: RE | Admit: 2011-05-05 | Discharge: 2011-05-05 | Disposition: A | Discharge status: Home / Self Care | Payer: BC Managed Care – PPO | Source: Ambulatory Visit | Attending: General Surgery | Admitting: General Surgery

## 2011-05-05 DIAGNOSIS — D0510 Intraductal carcinoma in situ of unspecified breast: Secondary | ICD-10-CM

## 2011-05-05 MED ORDER — GADOBENATE DIMEGLUMINE 529 MG/ML IV SOLN
11.0000 mL | Freq: Once | INTRAVENOUS | Status: AC | PRN
  Administered 2011-05-05: 11 mL via INTRAVENOUS

## 2011-05-06 ENCOUNTER — Encounter (INDEPENDENT_AMBULATORY_CARE_PROVIDER_SITE_OTHER): Payer: Self-pay | Admitting: General Surgery

## 2011-05-06 ENCOUNTER — Ambulatory Visit (INDEPENDENT_AMBULATORY_CARE_PROVIDER_SITE_OTHER): Payer: BC Managed Care – PPO | Admitting: General Surgery

## 2011-05-06 VITALS — BP 122/80 | HR 60 | Temp 97.4°F | Resp 16 | Ht 64.5 in | Wt 126.0 lb

## 2011-05-06 DIAGNOSIS — D0510 Intraductal carcinoma in situ of unspecified breast: Secondary | ICD-10-CM

## 2011-05-06 DIAGNOSIS — D0590 Unspecified type of carcinoma in situ of unspecified breast: Secondary | ICD-10-CM

## 2011-05-06 NOTE — Progress Notes (Signed)
Subjective:     Patient ID: [name], female   DOB: 1945/12/31, 65 y.o.   MRN: [id number]  HPI  This is a 65 year old female who I took to the operating room for excision of a mammographic abnormality that was unable to be biopsied percutaneously. She's doing well from that. 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 She has also undergone an MRI which showed no additional abnormalities. She comes in today to discuss her pathology results and future treatment plan.  Review of Systems     Objective:   Physical Exam  Pulmonary/Chest:         Assessment:     Right breast DCIS    Plan:        This was an excisional biopsy for mammographic findings patient was unable to get biopsied percutaneously. This is ductal carcinoma in situ with calcifications that is high-grade spanning 0.6 cm. Her prognostic panel is pending. There is ductal carcinoma in situ probably present at the deep margin and 0.16 cm to the superficial margin. A my operative report as well as a moderate memory I did excise this down to the pectoralis muscle. She is now undergone an MRI which shows no real other abnormalities.   We discussed the staging and pathophysiology of breast cancer. We discussed all of the different options for treatment for breast cancer including surgery, chemotherapy, radiation therapy, Herceptin, and antiestrogen therapy.  We discussed the options for treatment of the breast cancer which included lumpectomy versus a mastectomy. We discussed the performance of the lumpectomy with a wire placement.. We also discussed that she may need radiation therapy or antiestrogen therapy or both if she undergoes lumpectomy. We discussed the mastectomy and the postoperative care for that as well. We discussed that there is no difference in her survival whether she undergoes lumpectomy with radiation therapy or antiestrogen therapy versus a mastectomy. There is a slight difference in the  local recurrence rate being 3-5% with lumpectomy and about 1% with a mastectomy. We discussed the risks of operation including bleeding, infection, possible reoperation. She understands her further therapy will be based on what her stages at the time of her operation. I discussed with her obtaining a mammogram prior to deciding the next day. I want to see if there is any residual calcifications. I will follow up with her after this mammogram.

## 2011-05-07 ENCOUNTER — Ambulatory Visit
Admission: RE | Admit: 2011-05-07 | Discharge: 2011-05-07 | Disposition: A | Discharge status: Home / Self Care | Payer: BC Managed Care – PPO | Source: Ambulatory Visit | Attending: General Surgery | Admitting: General Surgery

## 2011-05-07 ENCOUNTER — Telehealth (INDEPENDENT_AMBULATORY_CARE_PROVIDER_SITE_OTHER): Payer: Self-pay

## 2011-05-07 DIAGNOSIS — D0510 Intraductal carcinoma in situ of unspecified breast: Secondary | ICD-10-CM

## 2011-05-08 NOTE — Telephone Encounter (Signed)
error 

## 2011-05-17 ENCOUNTER — Encounter (INDEPENDENT_AMBULATORY_CARE_PROVIDER_SITE_OTHER): Payer: Self-pay | Admitting: General Surgery

## 2011-05-17 ENCOUNTER — Ambulatory Visit (INDEPENDENT_AMBULATORY_CARE_PROVIDER_SITE_OTHER): Payer: BC Managed Care – PPO | Admitting: General Surgery

## 2011-05-17 ENCOUNTER — Telehealth (INDEPENDENT_AMBULATORY_CARE_PROVIDER_SITE_OTHER): Payer: Self-pay

## 2011-05-17 VITALS — BP 112/70 | HR 78 | Resp 16 | Ht 64.5 in | Wt 125.8 lb

## 2011-05-17 DIAGNOSIS — Z09 Encounter for follow-up examination after completed treatment for conditions other than malignant neoplasm: Secondary | ICD-10-CM

## 2011-05-17 NOTE — Telephone Encounter (Signed)
LMOM for pt to call me back so I can make sure she gets the appt with Dr Odis Luster for 05-20-11 @4 :00pm/ [facility]

## 2011-05-17 NOTE — Progress Notes (Signed)
Patient ID: [name], female   DOB: Aug 13, 1945, 65 y.o.   MRN: [id number]  Chief Complaint  Patient presents with  . Follow-up    Discuss mgm results    HPI [name] is a 65 y.o. female.   HPI This is a 65 year old female who I took to the operating room for excision of a mammographic abnormality that was unable to be biopsied percutaneously. She's doing well from that. 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 She has also undergone an MRI which showed no additional abnormalities. She has also undergone a mammogram which shows numerous residual calcifications. I've reviewed this both with the radiologist and at our multidisciplinary conference. She comes back in today to discuss what her options are. She has no real complaints today.  Past Medical History  Diagnosis Date  . GERD (gastroesophageal reflux disease)   . Osteoporosis   . Thyroid disease   . Enlarged lymph nodes   . Fibromyalgia   . Cancer     left breast dcis    Past Surgical History  Procedure Date  . Abdominal hysterectomy 1974  . Bilateral oophorectomy 1999  . Breast biopsy 04/24/11    left breast biopsy     Family History  Problem Relation Age of Onset  . Cancer Mother   . Cancer Father     colon  . Cancer Paternal Grandmother     breast    Social History History  Substance Use Topics  . Smoking status: Never Smoker   . Smokeless tobacco: Never Used  . Alcohol Use: Not on file    Allergies  Allergen Reactions  . Cyclobenzaprine Hcl Palpitations  . Sertraline Hcl Palpitations and Other (See Comments)    Causes visual sensitivity (especially to particular colors)  . Citalopram Hydrobromide   . Propoxyphene N-Acetaminophen   . Alendronate Sodium Nausea And Vomiting  . Naproxen Hives, Diarrhea and Nausea And Vomiting    Current Outpatient Prescriptions  Medication Sig Dispense Refill  . ALPRAZolam (XANAX) 0.5 MG tablet       . levothyroxine (SYNTHROID,  LEVOTHROID) 88 MCG tablet Take 88 mcg by mouth daily.        . nadolol (CORGARD) 20 MG tablet Take 20 mg by mouth daily. Patient takes 1/2 to 1 pill per day       . zolpidem (AMBIEN) 10 MG tablet Take 10 mg by mouth at bedtime as needed.          Review of Systems Review of Systems  Blood pressure 112/70, pulse 78, resp. rate 16, height 5' 4.5" (1.638 m), weight 125 lb 12.8 oz (57.063 kg).  Physical Exam Physical Exam Not performed today  Data Reviewed DIGITAL DIAGNOSTIC LEFT MAMMOGRAM  Comparison: 04/24/2011  Findings: Breast tissue is heterogenously dense. Magnification  views demonstrate residual pleomorphic calcifications in the  posterior 6 o'clock position of the left breast.  Mammographic images were processed with CAD.  IMPRESSION:  Numerous residual calcifications  BI-RADS CATEGORY 6: Known biopsy-proven malignancy - appropriate  action should be taken.  Recommendation:  The patient has scheduled further surgery with Dr. Dwain Sarna.   Assessment     Stage 0 left breast cancer     Plan     We discussed the staging and pathophysiology of breast cancer.   We discussed a sentinel lymph node biopsy as she does not appear to having lymph node involvement right now and we are performing mastectomy with DCIS. We discussed the performance  of that with injection of radioactive tracer and blue dye. . We discussed about a 1-2% risk lifetime of chronic shoulder pain as well as lymphedema associated with a sentinel lymph node biopsy.  We discussed the options for treatment of her breast cancer which included lumpectomy versus a mastectomy. We discussed the performance of the lumpectomy with a wire placement. We discussed a 10-20% chance of a positive margin requiring reexcision in the operating room. We also discussed that she may need radiation therapy or antiestrogen therapy or both if she undergoes lumpectomy. We discussed the mastectomy and the postoperative care for that as  well. We discussed that there is no difference in her survival whether she undergoes lumpectomy with radiation therapy or antiestrogen therapy versus a mastectomy. There is a slight difference in the local recurrence rate being 3-5% with lumpectomy and about 1% with a mastectomy. I recommended to her a mastectomy due to breast size and extent of remaining calcifications.  We discussed lumpectomy but I don't think this would be the best choice for her.  We also discussed reconstruction and I will make an appt with plastic surgery.  She asked if she could undergo bilateral mastectomies.  I told her this would not improve survival but is reasonable.   Plan will be bilateral mastectomy with left axillary sentinel node biopsy and possible immediate reconstruction. We discussed the risks of operation including bleeding, infection, possible reoperation. She understands her further therapy will be based on what her stages at the time of her operation.         Jil Penland 05/17/2011, 8:05 AM

## 2011-05-21 ENCOUNTER — Other Ambulatory Visit (INDEPENDENT_AMBULATORY_CARE_PROVIDER_SITE_OTHER): Payer: Self-pay | Admitting: General Surgery

## 2011-05-21 DIAGNOSIS — C50912 Malignant neoplasm of unspecified site of left female breast: Secondary | ICD-10-CM

## 2011-06-04 ENCOUNTER — Encounter (HOSPITAL_COMMUNITY): Payer: Self-pay | Admitting: Pharmacy Technician

## 2011-06-05 ENCOUNTER — Other Ambulatory Visit (HOSPITAL_COMMUNITY): Payer: BC Managed Care – PPO

## 2011-06-07 ENCOUNTER — Encounter (HOSPITAL_COMMUNITY): Payer: Self-pay

## 2011-06-07 ENCOUNTER — Encounter (HOSPITAL_COMMUNITY)
Admission: RE | Admit: 2011-06-07 | Discharge: 2011-06-07 | Disposition: A | Discharge status: Home / Self Care | Payer: BC Managed Care – PPO | Source: Ambulatory Visit | Attending: General Surgery | Admitting: General Surgery

## 2011-06-07 HISTORY — DX: Nausea with vomiting, unspecified: R11.2

## 2011-06-07 HISTORY — DX: Tachycardia, unspecified: R00.0

## 2011-06-07 HISTORY — DX: Other specified postprocedural states: Z98.890

## 2011-06-07 HISTORY — DX: Orthostatic hypotension: I95.1

## 2011-06-07 HISTORY — DX: Other specified cardiac arrhythmias: I49.8

## 2011-06-07 HISTORY — DX: Postural orthostatic tachycardia syndrome (POTS): G90.A

## 2011-06-07 LAB — CBC
HCT: 42.8 % (ref 36.0–46.0)
MCHC: 33.2 g/dL (ref 30.0–36.0)
RDW: 12.5 % (ref 11.5–15.5)

## 2011-06-07 LAB — BASIC METABOLIC PANEL
BUN: 14 mg/dL (ref 6–23)
GFR calc Af Amer: 85 mL/min — ABNORMAL LOW (ref >90)
GFR calc non Af Amer: 73 mL/min — ABNORMAL LOW (ref >90)
Potassium: 4.4 mEq/L (ref 3.5–5.1)

## 2011-06-07 NOTE — Progress Notes (Signed)
HAS SEEN DR. Hillis Range FOR A TENTATIVE ABLATION, BUT HE TOLD HER IT WASN'T NECESSARY... SHE EXPERIENCES THIS SYMDROME EVERY DAY.........DRINKS A LOT OF WATER.......BUT DR ALLRED PLACED HER ON  CORGARD.....Marland Kitchen"IT HELPS SOME"

## 2011-06-07 NOTE — Pre-Procedure Instructions (Signed)
20 [name]  06/07/2011   Your procedure is scheduled on:  Monday, December 20th                                                                                                                                                                                                                               Report to [facility] at  6:00 am                                                                                                                                                                                                                                   Call this number if you have problems the morning of surgery: [phone]   Remember:   Do not eat food:After Midnight. SUNDAY  May have clear liquids: up to 4 Hours before arrival  2:00 AM.  Clear liquids include soda, tea, black coffee, apple or grape juice, broth.  Take these medicines the morning of surgery with A SIP OF WATER: nadolol, synthroid, xanax  Do not wear jewelry, make-up or nail polish.  Do not wear lotions, powders, or perfumes. You may wear deodorant.  Do not shave 48 hours prior to surgery.  Do not bring valuables to the hospital.  Contacts, dentures or bridgework may not be worn into surgery.  Leave suitcase in the car. After surgery it  may be brought to your room.  For patients admitted to the hospital, checkout time is 11:00 AM the day of discharge.   Patients discharged the day of surgery will not be allowed to drive home.  Name and phone number of your driver: [name] -- SPOUSE  [phone]                                   Special Instructions: CHG Shower Use Special Wash: 1/2 bottle night before surgery and 1/2 bottle morning of surgery.   Please read over the following fact sheets that you were given: Pain Booklet, MRSA Information and Surgical Site Infection Prevention

## 2011-06-10 ENCOUNTER — Encounter: Payer: Self-pay | Admitting: *Deleted

## 2011-06-10 NOTE — Progress Notes (Signed)
Pt is scheduled for bilateral placement of tissue expanders on 06/20/11.

## 2011-06-19 MED ORDER — CEFAZOLIN SODIUM 1-5 GM-% IV SOLN
1.0000 g | INTRAVENOUS | Status: AC
  Administered 2011-06-20 (×2): 1 g via INTRAVENOUS
  Filled 2011-06-19: qty 50

## 2011-06-20 ENCOUNTER — Inpatient Hospital Stay (HOSPITAL_COMMUNITY): Payer: BC Managed Care – PPO

## 2011-06-20 ENCOUNTER — Inpatient Hospital Stay (HOSPITAL_COMMUNITY)
Admission: RE | Admit: 2011-06-20 | Discharge: 2011-06-22 | DRG: 258 | Disposition: A | Discharge status: Home / Self Care | Payer: BC Managed Care – PPO | Source: Ambulatory Visit | Attending: General Surgery | Admitting: General Surgery

## 2011-06-20 ENCOUNTER — Encounter (HOSPITAL_COMMUNITY): Payer: Self-pay | Admitting: *Deleted

## 2011-06-20 ENCOUNTER — Inpatient Hospital Stay (HOSPITAL_COMMUNITY)
Admission: RE | Admit: 2011-06-20 | Discharge: 2011-06-20 | Disposition: A | Discharge status: Home / Self Care | Payer: BC Managed Care – PPO | Source: Ambulatory Visit | Attending: General Surgery | Admitting: General Surgery

## 2011-06-20 ENCOUNTER — Inpatient Hospital Stay (HOSPITAL_COMMUNITY): Payer: BC Managed Care – PPO | Admitting: *Deleted

## 2011-06-20 ENCOUNTER — Encounter (HOSPITAL_COMMUNITY)
Admission: RE | Disposition: A | Discharge status: Home / Self Care | Payer: Self-pay | Source: Ambulatory Visit | Attending: General Surgery

## 2011-06-20 ENCOUNTER — Other Ambulatory Visit (INDEPENDENT_AMBULATORY_CARE_PROVIDER_SITE_OTHER): Payer: Self-pay | Admitting: General Surgery

## 2011-06-20 DIAGNOSIS — K219 Gastro-esophageal reflux disease without esophagitis: Secondary | ICD-10-CM | POA: Diagnosis present

## 2011-06-20 DIAGNOSIS — C50919 Malignant neoplasm of unspecified site of unspecified female breast: Principal | ICD-10-CM | POA: Diagnosis present

## 2011-06-20 DIAGNOSIS — E039 Hypothyroidism, unspecified: Secondary | ICD-10-CM | POA: Diagnosis present

## 2011-06-20 DIAGNOSIS — C50912 Malignant neoplasm of unspecified site of left female breast: Secondary | ICD-10-CM

## 2011-06-20 DIAGNOSIS — D249 Benign neoplasm of unspecified breast: Secondary | ICD-10-CM

## 2011-06-20 DIAGNOSIS — N6019 Diffuse cystic mastopathy of unspecified breast: Secondary | ICD-10-CM

## 2011-06-20 DIAGNOSIS — D0590 Unspecified type of carcinoma in situ of unspecified breast: Secondary | ICD-10-CM

## 2011-06-20 DIAGNOSIS — IMO0001 Reserved for inherently not codable concepts without codable children: Secondary | ICD-10-CM | POA: Diagnosis present

## 2011-06-20 SURGERY — SIMPLE MASTECTOMY
Anesthesia: General | Site: Breast | Laterality: Left | Wound class: Clean

## 2011-06-20 MED ORDER — LACTATED RINGERS IV SOLN
INTRAVENOUS | Status: DC | PRN
  Administered 2011-06-20 (×2): via INTRAVENOUS

## 2011-06-20 MED ORDER — 0.9 % SODIUM CHLORIDE (POUR BTL) OPTIME
TOPICAL | Status: DC | PRN
  Administered 2011-06-20: 1000 mL
  Administered 2011-06-20 (×2): 2000 mL

## 2011-06-20 MED ORDER — SODIUM CHLORIDE 0.9 % IR SOLN
Status: DC | PRN
  Administered 2011-06-20: 10:00:00

## 2011-06-20 MED ORDER — NADOLOL 20 MG PO TABS
20.0000 mg | ORAL_TABLET | Freq: Every day | ORAL | Status: DC
  Administered 2011-06-22: 20 mg via ORAL
  Filled 2011-06-20 (×2): qty 1

## 2011-06-20 MED ORDER — GLYCOPYRROLATE 0.2 MG/ML IJ SOLN
INTRAMUSCULAR | Status: DC | PRN
  Administered 2011-06-20: .5 mg via INTRAVENOUS

## 2011-06-20 MED ORDER — ALPRAZOLAM 0.5 MG PO TABS
0.5000 mg | ORAL_TABLET | Freq: Three times a day (TID) | ORAL | Status: DC
  Administered 2011-06-20 – 2011-06-22 (×6): 0.5 mg via ORAL
  Filled 2011-06-20 (×6): qty 1

## 2011-06-20 MED ORDER — SODIUM CHLORIDE 0.9 % IR SOLN
Status: DC | PRN
  Administered 2011-06-20: 11:00:00

## 2011-06-20 MED ORDER — MORPHINE SULFATE (PF) 1 MG/ML IV SOLN
INTRAVENOUS | Status: DC
  Administered 2011-06-20: 15:00:00 via INTRAVENOUS
  Administered 2011-06-20: 3 mg via INTRAVENOUS
  Administered 2011-06-20: 9 mg via INTRAVENOUS
  Administered 2011-06-21: 6 mg via INTRAVENOUS
  Administered 2011-06-21: 10.5 mg via INTRAVENOUS
  Administered 2011-06-21: 7.5 mg via INTRAVENOUS
  Administered 2011-06-21: 10.33 mg via INTRAVENOUS
  Filled 2011-06-20 (×2): qty 25

## 2011-06-20 MED ORDER — MIDAZOLAM HCL 5 MG/5ML IJ SOLN
INTRAMUSCULAR | Status: DC | PRN
  Administered 2011-06-20 (×2): 1 mg via INTRAVENOUS

## 2011-06-20 MED ORDER — ONDANSETRON HCL 4 MG/2ML IJ SOLN
INTRAMUSCULAR | Status: DC | PRN
  Administered 2011-06-20: 4 mg via INTRAVENOUS

## 2011-06-20 MED ORDER — SODIUM CHLORIDE 0.9 % IJ SOLN
INTRAMUSCULAR | Status: DC | PRN
  Administered 2011-06-20: 09:00:00 via INTRAMUSCULAR

## 2011-06-20 MED ORDER — LEVOTHYROXINE SODIUM 88 MCG PO TABS
88.0000 ug | ORAL_TABLET | Freq: Every day | ORAL | Status: DC
  Administered 2011-06-21 – 2011-06-22 (×2): 88 ug via ORAL
  Filled 2011-06-20 (×3): qty 1

## 2011-06-20 MED ORDER — ACETAMINOPHEN 325 MG PO TABS
650.0000 mg | ORAL_TABLET | Freq: Four times a day (QID) | ORAL | Status: DC | PRN
  Administered 2011-06-22: 650 mg via ORAL
  Filled 2011-06-20: qty 2

## 2011-06-20 MED ORDER — CEFAZOLIN SODIUM 1-5 GM-% IV SOLN
INTRAVENOUS | Status: AC
  Filled 2011-06-20: qty 50

## 2011-06-20 MED ORDER — SODIUM CHLORIDE 0.9 % IV SOLN
INTRAVENOUS | Status: DC
  Administered 2011-06-20: 16:00:00 via INTRAVENOUS

## 2011-06-20 MED ORDER — CEFAZOLIN SODIUM 1-5 GM-% IV SOLN
1.0000 g | Freq: Three times a day (TID) | INTRAVENOUS | Status: DC
  Administered 2011-06-20 – 2011-06-22 (×5): 1 g via INTRAVENOUS
  Filled 2011-06-20 (×6): qty 50

## 2011-06-20 MED ORDER — HYDROMORPHONE HCL PF 1 MG/ML IJ SOLN
0.2500 mg | INTRAMUSCULAR | Status: DC | PRN
  Administered 2011-06-20 (×4): 0.5 mg via INTRAVENOUS

## 2011-06-20 MED ORDER — DEXAMETHASONE SODIUM PHOSPHATE 4 MG/ML IJ SOLN
INTRAMUSCULAR | Status: DC | PRN
  Administered 2011-06-20: 4 mg via INTRAVENOUS

## 2011-06-20 MED ORDER — NEOSTIGMINE METHYLSULFATE 1 MG/ML IJ SOLN
INTRAMUSCULAR | Status: DC | PRN
  Administered 2011-06-20: 3 mg via INTRAVENOUS

## 2011-06-20 MED ORDER — ONDANSETRON HCL 4 MG/2ML IJ SOLN: 4.0000 mg | Freq: Four times a day (QID) | INTRAMUSCULAR | Status: DC | PRN

## 2011-06-20 MED ORDER — METHOCARBAMOL 500 MG PO TABS
500.0000 mg | ORAL_TABLET | Freq: Four times a day (QID) | ORAL | Status: DC | PRN
  Filled 2011-06-20: qty 1

## 2011-06-20 MED ORDER — CEFAZOLIN SODIUM 1-5 GM-% IV SOLN: 1.0000 g | Freq: Three times a day (TID) | INTRAVENOUS | Status: DC

## 2011-06-20 MED ORDER — VECURONIUM BROMIDE 10 MG IV SOLR
INTRAVENOUS | Status: DC | PRN
  Administered 2011-06-20: 5 mg via INTRAVENOUS
  Administered 2011-06-20: 1 mg via INTRAVENOUS
  Administered 2011-06-20 (×2): 5 mg via INTRAVENOUS

## 2011-06-20 MED ORDER — FENTANYL CITRATE 0.05 MG/ML IJ SOLN
INTRAMUSCULAR | Status: DC | PRN
  Administered 2011-06-20: 25 ug via INTRAVENOUS
  Administered 2011-06-20 (×5): 50 ug via INTRAVENOUS
  Administered 2011-06-20 (×2): 25 ug via INTRAVENOUS
  Administered 2011-06-20 (×3): 50 ug via INTRAVENOUS
  Administered 2011-06-20 (×2): 25 ug via INTRAVENOUS
  Administered 2011-06-20: 50 ug via INTRAVENOUS
  Administered 2011-06-20: 25 ug via INTRAVENOUS

## 2011-06-20 MED ORDER — SODIUM CHLORIDE 0.9 % IJ SOLN: 9.0000 mL | INTRAMUSCULAR | Status: DC | PRN

## 2011-06-20 MED ORDER — DIPHENHYDRAMINE HCL 12.5 MG/5ML PO ELIX
12.5000 mg | ORAL_SOLUTION | Freq: Four times a day (QID) | ORAL | Status: DC | PRN
  Filled 2011-06-20: qty 5

## 2011-06-20 MED ORDER — DOCUSATE SODIUM 100 MG PO CAPS
100.0000 mg | ORAL_CAPSULE | Freq: Every day | ORAL | Status: DC
  Administered 2011-06-21: 100 mg via ORAL
  Filled 2011-06-20 (×2): qty 1

## 2011-06-20 MED ORDER — HETASTARCH-ELECTROLYTES 6 % IV SOLN
INTRAVENOUS | Status: DC | PRN
  Administered 2011-06-20: 09:00:00 via INTRAVENOUS

## 2011-06-20 MED ORDER — PROPOFOL 10 MG/ML IV EMUL
INTRAVENOUS | Status: DC | PRN
  Administered 2011-06-20: 110 mg via INTRAVENOUS

## 2011-06-20 MED ORDER — ACETAMINOPHEN 650 MG RE SUPP: 650.0000 mg | Freq: Four times a day (QID) | RECTAL | Status: DC | PRN

## 2011-06-20 MED ORDER — EPHEDRINE SULFATE 50 MG/ML IJ SOLN
INTRAMUSCULAR | Status: DC | PRN
  Administered 2011-06-20 (×4): 5 mg via INTRAVENOUS

## 2011-06-20 MED ORDER — TECHNETIUM TC 99M SULFUR COLLOID FILTERED
1.0000 | Freq: Once | INTRAVENOUS | Status: AC | PRN
  Administered 2011-06-20: 1 via INTRADERMAL

## 2011-06-20 MED ORDER — NALOXONE HCL 0.4 MG/ML IJ SOLN: 0.4000 mg | INTRAMUSCULAR | Status: DC | PRN

## 2011-06-20 MED ORDER — ZOLPIDEM TARTRATE 5 MG PO TABS
10.0000 mg | ORAL_TABLET | Freq: Every evening | ORAL | Status: DC | PRN
  Administered 2011-06-20 – 2011-06-21 (×2): 10 mg via ORAL
  Filled 2011-06-20 (×2): qty 1
  Filled 2011-06-20: qty 2

## 2011-06-20 MED ORDER — ONDANSETRON HCL 4 MG/2ML IJ SOLN: 4.0000 mg | Freq: Once | INTRAMUSCULAR | Status: DC | PRN

## 2011-06-20 MED ORDER — DIPHENHYDRAMINE HCL 50 MG/ML IJ SOLN: 12.5000 mg | Freq: Four times a day (QID) | INTRAMUSCULAR | Status: DC | PRN

## 2011-06-20 MED ORDER — HYDROMORPHONE HCL PF 1 MG/ML IJ SOLN
INTRAMUSCULAR | Status: AC
  Filled 2011-06-20: qty 1

## 2011-06-20 MED ORDER — ROCURONIUM BROMIDE 100 MG/10ML IV SOLN
INTRAVENOUS | Status: DC | PRN
  Administered 2011-06-20: 50 mg via INTRAVENOUS

## 2011-06-20 MED ORDER — MEPERIDINE HCL 25 MG/ML IJ SOLN: 6.2500 mg | INTRAMUSCULAR | Status: DC | PRN

## 2011-06-20 SURGICAL SUPPLY — 98 items
APPLIER CLIP 9.375 MED OPEN (MISCELLANEOUS) ×3
ASEPTIC TRANSFER SYSTEM WITH DUAL CHECK ×3 IMPLANT
ATCH SMKEVC FLXB CAUT HNDSWH (FILTER) ×2 IMPLANT
BINDER BREAST LRG (GAUZE/BANDAGES/DRESSINGS) ×3 IMPLANT
BINDER BREAST XLRG (GAUZE/BANDAGES/DRESSINGS) IMPLANT
BIOPATCH RED 1 DISK 7.0 (GAUZE/BANDAGES/DRESSINGS) ×3 IMPLANT
BLADE SURG 10 STRL SS (BLADE) ×3 IMPLANT
BLADE SURG 15 STRL LF DISP TIS (BLADE) ×2 IMPLANT
BLADE SURG 15 STRL SS (BLADE) ×1
BLADE SURG ROTATE 9660 (MISCELLANEOUS) IMPLANT
CANISTER SUCTION 2500CC (MISCELLANEOUS) ×6 IMPLANT
CHLORAPREP W/TINT 26ML (MISCELLANEOUS) ×3 IMPLANT
CLIP APPLIE 9.375 MED OPEN (MISCELLANEOUS) ×2 IMPLANT
CLOTH BEACON ORANGE TIMEOUT ST (SAFETY) ×6 IMPLANT
CONT SPEC 4OZ CLIKSEAL STRL BL (MISCELLANEOUS) ×9 IMPLANT
CORDS BIPOLAR (ELECTRODE) ×3 IMPLANT
COVER SURGICAL LIGHT HANDLE (MISCELLANEOUS) ×6 IMPLANT
DECANTER SPIKE VIAL GLASS SM (MISCELLANEOUS) IMPLANT
DERMABOND ADVANCED (GAUZE/BANDAGES/DRESSINGS) ×1
DERMABOND ADVANCED .7 DNX12 (GAUZE/BANDAGES/DRESSINGS) ×2 IMPLANT
DRAIN CHANNEL 19F RND (DRAIN) ×9 IMPLANT
DRAPE LAPAROSCOPIC ABDOMINAL (DRAPES) IMPLANT
DRAPE ORTHO SPLIT 77X108 STRL (DRAPES) ×2
DRAPE PED LAPAROTOMY (DRAPES) IMPLANT
DRAPE PROXIMA HALF (DRAPES) ×3 IMPLANT
DRAPE SURG 17X11 SM STRL (DRAPES) ×6 IMPLANT
DRAPE SURG ORHT 6 SPLT 77X108 (DRAPES) ×4 IMPLANT
DRAPE UTILITY 15X26 W/TAPE STR (DRAPE) IMPLANT
DRAPE WARM FLUID 44X44 (DRAPE) ×3 IMPLANT
DRSG PAD ABDOMINAL 8X10 ST (GAUZE/BANDAGES/DRESSINGS) ×3 IMPLANT
DRSG TEGADERM 4X4.75 (GAUZE/BANDAGES/DRESSINGS) ×6 IMPLANT
ELECT BLADE 4.0 EZ CLEAN MEGAD (MISCELLANEOUS) ×6
ELECT BLADE 6.5 EXT (BLADE) ×3 IMPLANT
ELECT CAUTERY BLADE 6.4 (BLADE) ×6 IMPLANT
ELECT REM PT RETURN 9FT ADLT (ELECTROSURGICAL) ×3
ELECTRODE BLDE 4.0 EZ CLN MEGD (MISCELLANEOUS) ×4 IMPLANT
ELECTRODE REM PT RTRN 9FT ADLT (ELECTROSURGICAL) ×2 IMPLANT
EVACUATOR SILICONE 100CC (DRAIN) ×9 IMPLANT
EVACUATOR SMOKE ACCUVAC VALLEY (FILTER) ×1
GAUZE SPONGE 4X4 16PLY XRAY LF (GAUZE/BANDAGES/DRESSINGS) IMPLANT
GAUZE XEROFORM 5X9 LF (GAUZE/BANDAGES/DRESSINGS) IMPLANT
GLOVE BIO SURGEON STRL SZ7 (GLOVE) ×6 IMPLANT
GLOVE BIO SURGEON STRL SZ7.5 (GLOVE) ×6 IMPLANT
GLOVE BIOGEL PI IND STRL 6.5 (GLOVE) ×2 IMPLANT
GLOVE BIOGEL PI IND STRL 7.0 (GLOVE) ×6 IMPLANT
GLOVE BIOGEL PI IND STRL 7.5 (GLOVE) ×4 IMPLANT
GLOVE BIOGEL PI IND STRL 8 (GLOVE) ×2 IMPLANT
GLOVE BIOGEL PI INDICATOR 6.5 (GLOVE) ×1
GLOVE BIOGEL PI INDICATOR 7.0 (GLOVE) ×3
GLOVE BIOGEL PI INDICATOR 7.5 (GLOVE) ×2
GLOVE BIOGEL PI INDICATOR 8 (GLOVE) ×1
GLOVE ECLIPSE 6.5 STRL STRAW (GLOVE) ×12 IMPLANT
GLOVE SURG SS PI 7.0 STRL IVOR (GLOVE) ×3 IMPLANT
GOWN PREVENTION PLUS XLARGE (GOWN DISPOSABLE) ×3 IMPLANT
GOWN STRL NON-REIN LRG LVL3 (GOWN DISPOSABLE) ×15 IMPLANT
KIT BASIN OR (CUSTOM PROCEDURE TRAY) ×6 IMPLANT
KIT ROOM TURNOVER OR (KITS) ×3 IMPLANT
Medium Height Contour Profile tissue expander with (Breast) ×6 IMPLANT
NEEDLE 18GX1X1/2 (RX/OR ONLY) (NEEDLE) ×3 IMPLANT
NEEDLE 21 GA WING INFUSION (NEEDLE) ×3 IMPLANT
NEEDLE HYPO 25GX1X1/2 BEV (NEEDLE) ×3 IMPLANT
NS IRRIG 1000ML POUR BTL (IV SOLUTION) ×12 IMPLANT
PACK GENERAL/GYN (CUSTOM PROCEDURE TRAY) ×6 IMPLANT
PACK SURGICAL SETUP 50X90 (CUSTOM PROCEDURE TRAY) IMPLANT
PAD ARMBOARD 7.5X6 YLW CONV (MISCELLANEOUS) ×6 IMPLANT
PEN SKIN MARKING BROAD (MISCELLANEOUS) ×3 IMPLANT
PENCIL BUTTON HOLSTER BLD 10FT (ELECTRODE) IMPLANT
PREFILTER EVAC NS 1 1/3-3/8IN (MISCELLANEOUS) ×3 IMPLANT
SPONGE GAUZE 4X4 12PLY (GAUZE/BANDAGES/DRESSINGS) IMPLANT
SPONGE LAP 18X18 X RAY DECT (DISPOSABLE) ×3 IMPLANT
STAPLER VISISTAT 35W (STAPLE) ×6 IMPLANT
STRIP CLOSURE SKIN 1/2X4 (GAUZE/BANDAGES/DRESSINGS) IMPLANT
SUCTION FRAZIER TIP 10 FR DISP (SUCTIONS) ×3 IMPLANT
SUT ETHILON 2 0 FS 18 (SUTURE) IMPLANT
SUT ETHILON 5 0 PS 2 18 (SUTURE) IMPLANT
SUT MNCRL AB 3-0 PS2 18 (SUTURE) ×12 IMPLANT
SUT MNCRL AB 4-0 PS2 18 (SUTURE) IMPLANT
SUT MON AB 2-0 CT1 36 (SUTURE) ×12 IMPLANT
SUT MON AB 4-0 PC3 18 (SUTURE) IMPLANT
SUT PDS AB 2-0 CT1 27 (SUTURE) IMPLANT
SUT PROLENE 0 CT 1 CR/8 (SUTURE) ×6 IMPLANT
SUT PROLENE 2 0 CT2 30 (SUTURE) IMPLANT
SUT PROLENE 3 0 PS 2 (SUTURE) ×12 IMPLANT
SUT SILK 2 0 FS (SUTURE) ×3 IMPLANT
SUT VIC AB 2-0 FS1 27 (SUTURE) IMPLANT
SUT VIC AB 3-0 PS1 18 (SUTURE)
SUT VIC AB 3-0 PS1 18XBRD (SUTURE) IMPLANT
SUT VIC AB 3-0 SH 18 (SUTURE) ×12 IMPLANT
SUT VIC AB 3-0 SH 27 (SUTURE)
SUT VIC AB 3-0 SH 27XBRD (SUTURE) IMPLANT
SYR BULB IRRIGATION 50ML (SYRINGE) ×3 IMPLANT
SYR CONTROL 10ML LL (SYRINGE) ×3 IMPLANT
TOWEL OR 17X24 6PK STRL BLUE (TOWEL DISPOSABLE) IMPLANT
TOWEL OR 17X26 10 PK STRL BLUE (TOWEL DISPOSABLE) ×6 IMPLANT
TOWEL OR NON WOVEN STRL DISP B (DISPOSABLE) ×6 IMPLANT
TRAY FOLEY CATH 14FRSI W/METER (CATHETERS) ×3 IMPLANT
TUBE CONNECTING 12X1/4 (SUCTIONS) ×3 IMPLANT
WATER STERILE IRR 1000ML POUR (IV SOLUTION) IMPLANT

## 2011-06-20 NOTE — Plan of Care (Signed)
Problem: Consults Goal: Diagnosis-Modified Radical Mastectomy Outcome: Progressing Right Mastectomy and Left Mastectomy

## 2011-06-20 NOTE — Op Note (Signed)
Preoperative diagnosis: Left breast DCIS s/p lumpectomy with DCIS at margins, signficant remaining calcifications in left breast Postoperative diagnosis:  SAA  Procedure: Bilateral simple mastectomy Injection methylene blue for sentinel node identification Left axillary sentinel node biopsy  Surgeon: Dr. Harden Mo  Specimens: bilateral breast tissue, left axillary nodes with counts 4039, 275, 169 EBL: 50 cc Comps: none Anesthesia: GETA Sponge and needle counts correct x2 at end of operation Dispo of patient to recovery room in stable condition  Indications:  This is a 65 year old female had an abnormal left mammogram of multiple calcifications. She was not able to undergo stereotactic biopsy. I then to the operating room and did a wire localized biopsy this area. Her pathology showed ductal carcinoma in situ at the deep margin and very close to the superficial margin. I did a post biopsy mammogram and there are significant amount of remaining calcifications. I recommended a mastectomy on that side with a sentinel lymph node biopsy. She also wanted to have a right prophylactic mastectomy at the same time. We'll plan on doing this in conjunction with plastic surgery we'll do an immediate expander reconstruction.  Procedure: After informed consent was obtained she was injected in the standard periareolar fashion with technetium. She was then taken to the operating room. She was initially 1 g of intravenous cefazolin. Sequential compression devices were placed on her legs prior to induction with anesthesia. She was then placed under general endotracheal anesthesia without complication. A Foley catheter was placed. She was then prepped and draped in the standard sterile surgical fashion. A surgical timeout was performed.  I first approached the right side. An elliptical incision was made encompassing her nipple areolar complex. Flaps were made to the clavicle superiorly, sternum medially,  inframammary crease which he been marked previously, and the latissimus laterally. The breast was then removed from the pectoralis muscle including the pectoralis fascia. This was then marked with a short stitch superior and a long stitch lateral. Hemostasis was obtained on this side I then placed an antibiotic-soaked sponge overlying the muscle.  On the left side and made a similar elliptical incision that was a little bit bigger to encompass her old incision. Flaps were made in a similar way. I then removed the breast tissue from the pectoralis muscle without difficulty. I then used a neoprobe to identify 3 sentinel node with counts as listed above. The background radioactivity was 8. There were no more blue dye present in the axilla. Hemostasis was observed. I then irrigated this. I then placed an antibiotic soaked sponge on this side. The case was then turned over to Dr. Odis Luster with plastic surgery for immediate expander reconstruction.

## 2011-06-20 NOTE — Preoperative (Signed)
Beta Blockers   Reason not to administer Beta Blockers:Not Applicable, pt took Nadolol this am at 0330

## 2011-06-20 NOTE — Transfer of Care (Signed)
Immediate Anesthesia Transfer of Care Note  Patient: [name]  Procedure(s) Performed:  SIMPLE MASTECTOMY - bilateral simple mastectomies; LYMPH NODE BIOPSY - left sentinel lymph node biopsy nuclear medicine injection 30 minutes prior to surgery ; TRANSVERSE RECTUS ABDOMINIS MYOCUTANEOUS (TRAM) - bilateral tissue expanders  Patient Location: PACU  Anesthesia Type: General  Level of Consciousness: awake, oriented and patient cooperative  Airway & Oxygen Therapy: Patient Spontanous Breathing and Patient connected to face mask oxygen  Post-op Assessment: Report given to PACU RN and Post -op Vital signs reviewed and stable  Post vital signs: Reviewed  Complications: No apparent anesthesia complications

## 2011-06-20 NOTE — H&P (Signed)
HPI  [name] is a 65 y.o. female.  HPI  This is a 65 year old female who I took to the operating room for excision of a mammographic abnormality that was unable to be biopsied percutaneously. She's doing well from that. 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 She has also undergone an MRI which showed no additional abnormalities. She has also undergone a mammogram which shows numerous residual calcifications. I've reviewed this both with the radiologist and at our multidisciplinary conference. We have decided to perform a mastectomy on that side due to breast size and she would like to have a prophylactic mastectomy on the other side.  There have been no other changes in her medical history since I saw her last.  Past Medical History   Diagnosis  Date   .  GERD (gastroesophageal reflux disease)    .  Osteoporosis    .  Thyroid disease    .  Enlarged lymph nodes    .  Fibromyalgia    .  Cancer      left breast dcis    Past Surgical History   Procedure  Date   .  Abdominal hysterectomy  1974   .  Bilateral oophorectomy  1999   .  Breast biopsy  04/24/11     left breast biopsy    Family History   Problem  Relation  Age of Onset   .  Cancer  Mother    .  Cancer  Father       colon    .  Cancer  Paternal Grandmother       breast    Social History  History   Substance Use Topics   .  Smoking status:  Never Smoker   .  Smokeless tobacco:  Never Used   .  Alcohol Use:  Not on file    Allergies   Allergen  Reactions   .  Cyclobenzaprine Hcl  Palpitations   .  Sertraline Hcl  Palpitations and Other (See Comments)     Causes visual sensitivity (especially to particular colors)   .  Citalopram Hydrobromide    .  Propoxyphene N-Acetaminophen    .  Alendronate Sodium  Nausea And Vomiting   .  Naproxen  Hives, Diarrhea and Nausea And Vomiting    Current Outpatient Prescriptions   Medication  Sig  Dispense  Refill   .  ALPRAZolam (XANAX) 0.5 MG  tablet      .  levothyroxine (SYNTHROID, LEVOTHROID) 88 MCG tablet  Take 88 mcg by mouth daily.     .  nadolol (CORGARD) 20 MG tablet  Take 20 mg by mouth daily. Patient takes 1/2 to 1 pill per day     .  zolpidem (AMBIEN) 10 MG tablet  Take 10 mg by mouth at bedtime as needed.      Review of Systems  Review of Systems  Blood pressure 112/70, pulse 78, resp. rate 16, height 5' 4.5" (1.638 m), weight 125 lb 12.8 oz (57.063 kg).  Physical Exam  Physical Exam  Left breast incision without infection, no other masses Right breast without masses  Data Reviewed  DIGITAL DIAGNOSTIC LEFT MAMMOGRAM  Comparison: 04/24/2011  Findings: Breast tissue is heterogenously dense. Magnification  views demonstrate residual pleomorphic calcifications in the  posterior 6 o'clock position of the left breast.  Mammographic images were processed with CAD.  IMPRESSION:  Numerous residual calcifications  BI-RADS CATEGORY 6: Known biopsy-proven malignancy - appropriate  action should be taken.  Recommendation:  The patient has scheduled further surgery with Dr. Dwain Sarna.  Assessment   Stage 0 left breast cancer   Plan   We discussed the staging and pathophysiology of breast cancer.  We discussed a sentinel lymph node biopsy as she does not appear to having lymph node involvement right now and we are performing mastectomy with DCIS. We discussed the performance of that with injection of radioactive tracer and blue dye. . We discussed about a 1-2% risk lifetime of chronic shoulder pain as well as lymphedema associated with a sentinel lymph node biopsy.  We discussed the options for treatment of her breast cancer which included lumpectomy versus a mastectomy. We discussed the performance of the lumpectomy with a wire placement. We discussed a 10-20% chance of a positive margin requiring reexcision in the operating room. We also discussed that she may need radiation therapy or antiestrogen therapy or both if she  undergoes lumpectomy. We discussed the mastectomy and the postoperative care for that as well. We discussed that there is no difference in her survival whether she undergoes lumpectomy with radiation therapy or antiestrogen therapy versus a mastectomy. There is a slight difference in the local recurrence rate being 3-5% with lumpectomy and about 1% with a mastectomy.  I recommended to her a mastectomy due to breast size and extent of remaining calcifications. We discussed lumpectomy but I don't think this would be the best choice for her. . She asked if she could undergo bilateral mastectomies. I told her this would not improve survival but is reasonable.  Plan will be bilateral mastectomy with left axillary sentinel node biopsy and  immediate reconstruction with expanders with Dr. Odis Luster. We discussed the risks of operation including bleeding, infection, possible reoperation. She understands her further therapy will be based on what her stages at the time of her operation.

## 2011-06-20 NOTE — Anesthesia Preprocedure Evaluation (Addendum)
Anesthesia Evaluation  Patient identified by MRN, date of birth, ID band Patient awake    Reviewed: Allergy & Precautions, NPO status , Patient's Chart, lab work & pertinent test results, reviewed documented beta blocker date and time   History of Anesthesia Complications (+) PONV  Airway Mallampati: I TM Distance: >3 FB Neck ROM: Full    Dental  (+) Teeth Intact and Dental Advisory Given   Pulmonary  clear to auscultation        Cardiovascular + angina + dysrhythmias Regular Normal    Neuro/Psych  Neuromuscular disease    GI/Hepatic Neg liver ROS, GERD-  Medicated and Controlled,  Endo/Other  Hypothyroidism   Renal/GU negative Renal ROS     Musculoskeletal  (+) Fibromyalgia -  Abdominal Normal abdominal exam  (+)   Peds  Hematology negative hematology ROS (+)   Anesthesia Other Findings   Reproductive/Obstetrics negative OB ROS                           Anesthesia Physical Anesthesia Plan  ASA: II  Anesthesia Plan: General and General ETT   Post-op Pain Management:    Induction: Intravenous  Airway Management Planned: Oral ETT  Additional Equipment:   Intra-op Plan:   Post-operative Plan: Extubation in OR  Informed Consent: I have reviewed the patients History and Physical, chart, labs and discussed the procedure including the risks, benefits and alternatives for the proposed anesthesia with the patient or authorized representative who has indicated his/her understanding and acceptance.   Dental advisory given  Plan Discussed with: Surgeon and CRNA  Anesthesia Plan Comments:        Anesthesia Quick Evaluation

## 2011-06-20 NOTE — Brief Op Note (Signed)
06/20/2011  1:29 PM  PATIENT:  [name]  65 y.o. female  PRE-OPERATIVE DIAGNOSIS:  left breast cancer   POST-OPERATIVE DIAGNOSIS:  left breast cancer  PROCEDURE:  Procedure(s): SIMPLE MASTECTOMY BILATERAL LYMPH NODE BIOPSY BILATERAL BREAST RECONSTRUCTION WITH TISSUE EXPANDERS  SURGEON:  Surgeon(s): Emelia Loron, MD Jari Sportsman  PHYSICIAN ASSISTANT:   ASSISTANTS: none   ANESTHESIA:   general  EBL:  Total I/O In: 3300 [I.V.:2800; IV Piggyback:500] Out: 875 [Urine:775; Blood:100]  BLOOD ADMINISTERED:none  DRAINS: (3) Jackson-Pratt drain(s) with closed bulb suction in the mastectomy defects (1 right side two left side)   LOCAL MEDICATIONS USED:  NONE  SPECIMEN:  No Specimen  DISPOSITION OF SPECIMEN:  N/A  COUNTS:  YES  TOURNIQUET:  * No tourniquets in log *  DICTATION: .Other Dictation: Dictation Number [id number]  PLAN OF CARE: Admit to inpatient   PATIENT DISPOSITION:  PACU - hemodynamically stable.   Delay start of Pharmacological VTE agent (>24hrs) due to surgical blood loss or risk of bleeding:  {YES/NO/NOT APPLICABLE:20182

## 2011-06-21 ENCOUNTER — Encounter (HOSPITAL_COMMUNITY): Payer: Self-pay | Admitting: General Surgery

## 2011-06-21 LAB — BASIC METABOLIC PANEL
BUN: 8 mg/dL (ref 6–23)
CO2: 26 mEq/L (ref 19–32)
GFR calc non Af Amer: 89 mL/min — ABNORMAL LOW (ref >90)
Glucose, Bld: 109 mg/dL — ABNORMAL HIGH (ref 70–99)
Potassium: 4.1 mEq/L (ref 3.5–5.1)

## 2011-06-21 LAB — CBC
HCT: 32.5 % — ABNORMAL LOW (ref 36.0–46.0)
Hemoglobin: 10.7 g/dL — ABNORMAL LOW (ref 12.0–15.0)
MCH: 30.3 pg (ref 26.0–34.0)
MCHC: 32.9 g/dL (ref 30.0–36.0)
MCV: 92.1 fL (ref 78.0–100.0)

## 2011-06-21 MED ORDER — MORPHINE SULFATE 2 MG/ML IJ SOLN: 1.0000 mg | INTRAMUSCULAR | Status: DC | PRN

## 2011-06-21 MED ORDER — OXYCODONE HCL 5 MG PO TABS
5.0000 mg | ORAL_TABLET | ORAL | Status: DC | PRN
  Administered 2011-06-21: 5 mg via ORAL
  Filled 2011-06-21: qty 1

## 2011-06-21 MED ORDER — HYDROMORPHONE HCL 2 MG PO TABS
2.0000 mg | ORAL_TABLET | ORAL | Status: DC | PRN
  Administered 2011-06-21: 2 mg via ORAL
  Administered 2011-06-22 (×3): 4 mg via ORAL
  Filled 2011-06-21: qty 1
  Filled 2011-06-21 (×3): qty 2

## 2011-06-21 MED ORDER — METHOCARBAMOL 500 MG PO TABS
500.0000 mg | ORAL_TABLET | Freq: Four times a day (QID) | ORAL | Status: DC
  Administered 2011-06-21 – 2011-06-22 (×3): 500 mg via ORAL
  Filled 2011-06-21 (×6): qty 1

## 2011-06-21 NOTE — Anesthesia Postprocedure Evaluation (Signed)
  Anesthesia Post-op Note  Patient: [name]  Procedure(s) Performed:  SIMPLE MASTECTOMY - bilateral simple mastectomies; LYMPH NODE BIOPSY - left sentinel lymph node biopsy nuclear medicine injection 30 minutes prior to surgery ; TRANSVERSE RECTUS ABDOMINIS MYOCUTANEOUS (TRAM) - bilateral tissue expanders  Patient Location: 5100  Anesthesia Type: General  Level of Consciousness: awake, alert , oriented and patient cooperative  Airway and Oxygen Therapy: Patient Spontanous Breathing  Post-op Pain: none  Post-op Assessment: Post-op Vital signs reviewed, Patient's Cardiovascular Status Stable, Respiratory Function Stable, Patent Airway, No signs of Nausea or vomiting, Adequate PO intake and Pain level controlled  Post-op Vital Signs: Reviewed and stable  Complications: No apparent anesthesia complications

## 2011-06-21 NOTE — Progress Notes (Signed)
Subjective: Pain was controlled well on Dilaudid PCA. Not as well on oxycontin IR. Tolerating liquids.  Objective: Vital signs in last 24 hours: Temp:  [97.5 F (36.4 C)-98.2 F (36.8 C)] 98.2 F (36.8 C) (12/21 0900) Pulse Rate:  [70-84] 77  (12/21 0900) Resp:  [16-20] 16  (12/21 0900) BP: (88-114)/(45-69) 98/57 mmHg (12/21 0900) SpO2:  [98 %-100 %] 100 % (12/21 0900) Weight:  [125 lb (56.7 kg)] 125 lb (56.7 kg) (12/20 2011)  Intake/Output from previous day: 12/20 0701 - 12/21 0700 In: 3705 [I.V.:3205; IV Piggyback:500] Out: 1900 [Urine:1275; Drains:525; Blood:100] Intake/Output this shift: Total I/O In: 240 [P.O.:240] Out: 115 [Drains:115]  Operative sites: Mastectomy flaps viable. Excellent color.Tissue expanders in good position. Drains functioning. Drainage thin.   Basename 06/21/11 0532  WBC 10.9*  HGB 10.7*  HCT 32.5*  NA 137  K 4.1  CL 103  CO2 26  BUN 8  CREATININE 0.72  GLU --    Studies/Results: Nm Sentinel Node Inj-no Rpt (breast)  06/20/2011  CLINICAL DATA: LT BREAST INJECTION   Sulfur colloid was injected intradermally by the nuclear medicine  technologist for breast cancer sentinel node localization.      Assessment/Plan: Will switch to Dilaudid PO and give Robaxin on scheduled basis. Hopefully that combination will improve her pain relief.  LOS: 1 day    Kylan Veach M 06/21/2011 2:46 PM

## 2011-06-21 NOTE — Discharge Summary (Signed)
Physician Discharge Summary  Patient ID: [name] MRN: [id number] DOB/AGE: Mar 10, 1946 65 y.o.  Admit date: 06/20/2011 Discharge date: 06/21/2011  Admission Diagnoses: Left breast DCIS Fibromyalgia  Discharge Diagnoses:  Active Problems:  * No active hospital problems. *    Discharged Condition: good  Hospital Course: 12 yof who underwent a left breast biopsy for abnormal calcifications unable to be biopsied stereotactically.  This was DCIS with margins close and positive.  Repeat MMG showed many residual calcifications.  Due to breast size we discussed mastectomy with sentinel node biopsy.  She also wanted a prophylactic right mastectomy.  She underwent bilateral simple mastectomy with left axillary sentinel node biopsy in conjunction with Dr. Odis Luster who performed bilateral tissue expanders.  She has done well postoperatively.  Pathology is pending.  She will be discharged home tolerated diet, pain controlled with flaps being viable.  Consults: none  Significant Diagnostic Studies: none  Treatments: surgery: bilateral simple mastectomies, left axillary snbx, bilateral tissue expander placement    Disposition: Home or Self Care   Medication List  As of 06/21/2011 10:27 AM   ASK your doctor about these medications         ALPRAZolam 0.5 MG tablet   Commonly known as: XANAX      levothyroxine 88 MCG tablet   Commonly known as: SYNTHROID, LEVOTHROID      nadolol 20 MG tablet   Commonly known as: CORGARD      zolpidem 10 MG tablet   Commonly known as: AMBIEN           Follow-up Information    Follow up with Tiffany Goens, MD in 3 weeks.   Contact information:   [facility] [address] [place] [address] [phone]          Signed: Emelia Clarke 06/21/2011, 10:27 AM

## 2011-06-21 NOTE — Op Note (Signed)
[name], [name] NO.:  [id number]  MEDICAL RECORD NO.:  [id number]  LOCATION:  5127                         FACILITY:  [facility]  PHYSICIAN:  [name], M.D.     DATE OF BIRTH:  Aug 19, 1945  DATE OF PROCEDURE:  06/20/2011 DATE OF DISCHARGE:                              OPERATIVE REPORT   PREOPERATIVE DIAGNOSIS:  Breast cancer.  POSTOPERATIVE DIAGNOSIS:  Breast cancer.  PROCEDURE PERFORMED:  Bilateral breast reconstruction with tissue expander.  SURGEON:  Tiffany Sjogren, MD  ANESTHESIA:  General.  ESTIMATED BLOOD LOSS:  20 mL.  DRAINS:  One 19-French right, two 19-French left.  CLINICAL NOTE:  A 64 year old woman has breast cancer, has opted for mastectomy, in fact bilateral mastectomy and she desired reconstruction options discussed and she selected tissue expander followed as a staged procedure by placement of implant.  The nature of procedure and the risks plus complications were discussed with her in great detail.  Risks including, not limited to, bleeding, infection, anesthesia, complications, healing problems, scarring, loss of sensation, fluid accumulations, failure of device, capsular contracture, displacement device, wrinkles, ripples, pneumothorax, pulmonary embolism, asymmetry disappointment, chronic pain and she understood all of this and wished to proceed.  DESCRIPTION OF PROCEDURE:  The patient was in the operating room and the mastectomy had been completed, bilateral by Dr. Dwain Sarna.  Mastectomy flaps were inspected.  They were found to be in excellent condition. The wound was irrigated thoroughly with saline and hemostasis with electrocautery.  The muscle origins were reinforced with a few 3-0 Vicryl interrupted figure-of-eight sutures because the muscles were very thin distally at the junction with the rectus muscle and then the dissection was begun at the lateral border of the pectoralis major muscle raising the pectoralis major  and the serratus anterior laterally and then raising these muscles inferiorly down to the inframammary crease as it had been marked preoperatively.  The space had been created for the tissue expanders.  Meticulous hemostasis with electrocautery. Irrigation with saline antibiotic solution.  Again hemostasis with electrocautery.  Excellent hemostasis having been confirmed, the expanders were prepared after thoroughly cleaning gloves.  These were mentor 275 mL tissue expanders, medium height that were soaked in antibiotic solution for greater than 5 minutes.  All the air was removed.  A 30 mL sterile saline was placed using a closed filling system and again care was taken to make sure that the expanders were flat and the expanders were positioned and care was taken to make sure that they were positioned all the way down to the inferior aspect of the submuscular space.  Antibiotic solution again placed and the muscle was closed using 3-0 Vicryl interrupted figure-of-eight sutures.  A 19- French drain was positioned and brought through separate stab wound inferolaterally on the right and secured with 3-0 Prolene suture.  This was left curled all the way around the mastectomy defect.  Due to the lymph node dissection on the left, a 19-French drain was left in the area of the lymph node dissection and then an additional drain was placed under the mastectomy flaps, brought through separate stab wounds inferolaterally and secured with 3-0 Prolene sutures.  The check of the  mastectomy flaps after irrigation with saline and hemostasis with electrocautery.  Excellent hemostasis confirmed.  Mastectomy flaps continued to have good color and bright red bleeding and periphery consistent with viability.  The mastectomy wounds were closed with 3-0 Monocryl inverted deep dermal sutures followed by Dermabond.  Biopatch and medium Tegaderm placed for the drains and ABDs and she was placed in the chest vest  for some gentle compression and she was transported to the recovery room stable, having tolerated the procedure well and disposition, she will be admitted to Dr. Odis Luster.     [name], M.D.     DB/MEDQ  D:  06/20/2011  T:  06/21/2011  Job:  [id number]  cc:   [name], M.D.

## 2011-06-21 NOTE — Progress Notes (Signed)
1 Day Post-Op  Subjective: Complains of being sore but otherwise well, pain fair control  Objective: Vital signs in last 24 hours: Temp:  [97.5 F (36.4 C)-98.2 F (36.8 C)] 98.2 F (36.8 C) (12/21 0900) Pulse Rate:  [70-84] 77  (12/21 0900) Resp:  [11-20] 16  (12/21 0900) BP: (88-114)/(45-69) 98/57 mmHg (12/21 0900) SpO2:  [98 %-100 %] 100 % (12/21 0900) Weight:  [125 lb (56.7 kg)] 125 lb (56.7 kg) (12/20 2011) Last BM Date: 06/19/11  Intake/Output from previous day: 12/20 0701 - 12/21 0700 In: 3705 [I.V.:3205; IV Piggyback:500] Out: 1900 [Urine:1275; Drains:525; Blood:100] Intake/Output this shift:   Flaps viable bilaterally, without infection, jps with high output but thin and serosang  Lab Results:   [facility] 06/21/11 0532  WBC 10.9*  HGB 10.7*  HCT 32.5*  PLT 208   BMET  Basename 06/21/11 0532  NA 137  K 4.1  CL 103  CO2 26  GLUCOSE 109*  BUN 8  CREATININE 0.72  CALCIUM 8.7   PT/INR No results found for this basename: LABPROT:2,INR:2 in the last 72 hours ABG No results found for this basename: PHART:2,PCO2:2,PO2:2,HCO3:2 in the last 72 hours  Studies/Results: Nm Sentinel Node Inj-no Rpt (breast)  06/20/2011  CLINICAL DATA: LT BREAST INJECTION   Sulfur colloid was injected intradermally by the nuclear medicine  technologist for breast cancer sentinel node localization.      Anti-infectives: Anti-infectives     Start     Dose/Rate Route Frequency Ordered Stop   06/20/11 1800   ceFAZolin (ANCEF) IVPB 1 g/50 mL premix        1 g 100 mL/hr over 30 Minutes Intravenous Every 8 hours 06/20/11 1536 06/22/11 1759   06/20/11 1545   ceFAZolin (ANCEF) IVPB 1 g/50 mL premix  Status:  Discontinued        1 g 100 mL/hr over 30 Minutes Intravenous 3 times per day 06/20/11 1536 06/20/11 1539   06/20/11 1141   ceFAZolin (ANCEF) 1-5 GM-% IVPB  Status:  Discontinued     Comments: BUTLER, DAVE: cabinet override         06/20/11 1141 06/20/11 1144   06/20/11 1051    polymyxin B 500,000 Units, bacitracin 50,000 Units in sodium chloride irrigation 0.9 % 500 mL irrigation  Status:  Discontinued          As needed 06/20/11 1052 06/20/11 1317   06/20/11 0935   polymyxin B 500,000 Units, bacitracin 50,000 Units in sodium chloride irrigation 0.9 % 500 mL irrigation  Status:  Discontinued          As needed 06/20/11 0936 06/20/11 1317   06/19/11 1515   ceFAZolin (ANCEF) IVPB 1 g/50 mL premix        1 g 100 mL/hr over 30 Minutes Intravenous 60 min pre-op 06/19/11 1510 06/20/11 1130          Assessment/Plan: S/p bilateral simple mastectomy and left axillary sentinel node biopsy S/p bilateral tissue expander placement  Oral pain meds with iv backup Regular diet Monitor drains another 24 hours Pulm toilet, oob as much as possible Dc ivf scds Hopefully home tomorrow   LOS: 1 day    [facility] 06/21/2011

## 2011-06-22 MED ORDER — MAGNESIUM HYDROXIDE NICU ORAL SYRINGE 400 MG/5 ML
30.0000 mL | Freq: Every day | ORAL | Status: DC | PRN
  Administered 2011-06-22: 30 mL via ORAL
  Filled 2011-06-22: qty 30

## 2011-06-22 MED ORDER — METHOCARBAMOL 500 MG PO TABS: 500.0000 mg | ORAL_TABLET | Freq: Four times a day (QID) | ORAL | Status: AC

## 2011-06-22 NOTE — Discharge Summary (Signed)
Physician Discharge Summary  Patient ID: [name] MRN: [id number] DOB/AGE: 02-20-1946 65 y.o.  Admit date: 06/20/2011 Discharge date: 06/22/2011  Admission Diagnoses:Breast cancer  Discharge Diagnoses: Same Active Problems:  * No active hospital problems. *    Discharged Condition: good  Hospital Course: On the day of admission the patient was taken to surgery and had bilateral mastectomy and reconstruction with tissue expanders. The patient tolerated the procedures well. Postoperatively, the mastectomy flaps maintained excellent color and capillary refill.The patient was ambulatory and tolerating diet on the first postoperative day. She found that her pain was best relieved by a combination of Dilaudid po and Robaxin. She had no nausea. There was no evidence of bleeding or infection. Drains were functioning and had clear drainage. She felt that she was ready for discharge.  Treatments: antibiotics: Ancef, anticoagulation: none and surgery: bilateral mastectomy, left sentinel lymph node biopsy, and reconstruction with tissue expanders.  Discharge Exam: Blood pressure 126/64, pulse 95, temperature 98.3 F (36.8 C), temperature source Oral, resp. rate 18, height 5\' 4"  (1.626 m), weight 125 lb (56.7 kg), SpO2 98.00%.  Operative sites: Mastectomy flaps viable. Good color throughout. Tissue expanders in good position. Drains functioning. Drainage thin.  Disposition: Home or Self Care  Discharge Orders    Future Orders Please Complete By Expires   MR Breast Bilateral W Contrast   06/29/12   Questions: Responses:   Does the patient have a pacemaker, internal devices, implants, aneury No   Preferred imaging location? [facility]   Reason for exam: 65 yof with new diagnosis of left breast dcis, please evaluate with mri     Current Discharge Medication List    START taking these medications   Details  methocarbamol (ROBAXIN) 500 MG tablet Take 1 tablet (500 mg total) by  mouth 4 (four) times daily. Qty: 40 tablet, Refills: 1      CONTINUE these medications which have NOT CHANGED   Details  ALPRAZolam (XANAX) 0.5 MG tablet Take 0.5 mg by mouth 3 (three) times daily.     levothyroxine (SYNTHROID, LEVOTHROID) 88 MCG tablet Take 88 mcg by mouth daily.     nadolol (CORGARD) 20 MG tablet Take 20 mg by mouth daily. Patient takes 1/2 to 1 pill per day    zolpidem (AMBIEN) 10 MG tablet Take 10 mg by mouth at bedtime as needed. For sleep       Follow-up Information    Follow up with [facility], MD in 3 weeks.   Contact information:   [facility] [address] [place] [address] [phone]          Signed: Rossie Clarke 06/22/2011, 10:22 AM

## 2011-06-22 NOTE — Progress Notes (Signed)
2 Days Post-Op  Subjective: Complains of being sore but otherwise well, pain fair control. No BM yet.  Objective: Vital signs in last 24 hours: Temp:  [97.6 F (36.4 C)-98.4 F (36.9 C)] 98.3 F (36.8 C) (12/22 0538) Pulse Rate:  [76-95] 95  (12/22 0538) Resp:  [16-18] 18  (12/22 0538) BP: (99-126)/(51-64) 126/64 mmHg (12/22 0538) SpO2:  [97 %-100 %] 98 % (12/22 0538) Last BM Date: 06/19/11  Intake/Output from previous day: 12/21 0701 - 12/22 0700 In: 480 [P.O.:480] Out: 315 [Drains:315] Intake/Output this shift:   Flaps viable bilaterally, without infection, jps with decreased output but thin and serosang. No hematoma  Lab Results:   Basename 06/21/11 0532  WBC 10.9*  HGB 10.7*  HCT 32.5*  PLT 208   BMET  Basename 06/21/11 0532  NA 137  K 4.1  CL 103  CO2 26  GLUCOSE 109*  BUN 8  CREATININE 0.72  CALCIUM 8.7   PT/INR No results found for this basename: LABPROT:2,INR:2 in the last 72 hours ABG No results found for this basename: PHART:2,PCO2:2,PO2:2,HCO3:2 in the last 72 hours  Studies/Results: No results found.  Anti-infectives: Anti-infectives     Start     Dose/Rate Route Frequency Ordered Stop   06/20/11 1800   ceFAZolin (ANCEF) IVPB 1 g/50 mL premix        1 g 100 mL/hr over 30 Minutes Intravenous Every 8 hours 06/20/11 1536 06/22/11 1759   06/20/11 1545   ceFAZolin (ANCEF) IVPB 1 g/50 mL premix  Status:  Discontinued        1 g 100 mL/hr over 30 Minutes Intravenous 3 times per day 06/20/11 1536 06/20/11 1539   06/20/11 1141   ceFAZolin (ANCEF) 1-5 GM-% IVPB  Status:  Discontinued     Comments: BUTLER, DAVE: cabinet override         06/20/11 1141 06/20/11 1144   06/20/11 1051   polymyxin B 500,000 Units, bacitracin 50,000 Units in sodium chloride irrigation 0.9 % 500 mL irrigation  Status:  Discontinued          As needed 06/20/11 1052 06/20/11 1317   06/20/11 0935   polymyxin B 500,000 Units, bacitracin 50,000 Units in sodium chloride  irrigation 0.9 % 500 mL irrigation  Status:  Discontinued          As needed 06/20/11 0936 06/20/11 1317   06/19/11 1515   ceFAZolin (ANCEF) IVPB 1 g/50 mL premix        1 g 100 mL/hr over 30 Minutes Intravenous 60 min pre-op 06/19/11 1510 06/20/11 1130          Assessment/Plan: S/p bilateral simple mastectomy and left axillary sentinel node biopsy S/p bilateral tissue expander placement  Oral pain meds with iv backup Regular diet Monitor drains another 24 hours Pulm toilet, oob as much as possible Dc ivf scds Hopefully home tomorrow   LOS: 2 days    [name] J 06/22/2011

## 2011-06-24 NOTE — Progress Notes (Signed)
UR of chart completed retroactively.   

## 2011-07-09 ENCOUNTER — Telehealth (INDEPENDENT_AMBULATORY_CARE_PROVIDER_SITE_OTHER): Payer: Self-pay | Admitting: General Surgery

## 2011-07-10 ENCOUNTER — Telehealth (INDEPENDENT_AMBULATORY_CARE_PROVIDER_SITE_OTHER): Payer: Self-pay

## 2011-07-10 NOTE — Telephone Encounter (Signed)
Tried to call pt back from message but no answer at home and cell I couldn't leave a message. I made pt an appt for her to come back next wk. With Dr Dwain Sarna.

## 2011-07-18 ENCOUNTER — Encounter (INDEPENDENT_AMBULATORY_CARE_PROVIDER_SITE_OTHER): Payer: Self-pay | Admitting: General Surgery

## 2011-07-18 ENCOUNTER — Ambulatory Visit (INDEPENDENT_AMBULATORY_CARE_PROVIDER_SITE_OTHER): Payer: BC Managed Care – PPO | Admitting: General Surgery

## 2011-07-18 VITALS — BP 124/74 | HR 83 | Temp 98.6°F | Ht 64.5 in | Wt 122.2 lb

## 2011-07-18 DIAGNOSIS — D0590 Unspecified type of carcinoma in situ of unspecified breast: Secondary | ICD-10-CM

## 2011-07-18 DIAGNOSIS — Z09 Encounter for follow-up examination after completed treatment for conditions other than malignant neoplasm: Secondary | ICD-10-CM

## 2011-07-18 DIAGNOSIS — D0510 Intraductal carcinoma in situ of unspecified breast: Secondary | ICD-10-CM

## 2011-07-18 NOTE — Progress Notes (Signed)
Subjective:     Patient ID: [name], female   DOB: [birth year], 66 y.o.   MRN: [id number]  HPI 89 yof who eventually underwent bilateral simple mastectomies with axillary snbx for extensive DCIS.  Her final path did not show more DCIS and node was negative.  She underwent bilateral expander placement also.  She is doing well but does have some significant pain after surgery.  She is otherwise doing well.   Review of Systems     Objective:   Physical Exam Bilateral expanders in place, wounds healing well, no infection    Assessment:     Stage 0 breast cancer    Plan:    Follow up with Dr. Odis Luster as scheduled PT appointment, will have restrictions per Dr. Odis Luster Will refer to [facility] onc [facility] breast center although she should be done with treatment I think would be beneficial to her to see them and the cancer center.

## 2011-07-23 ENCOUNTER — Other Ambulatory Visit: Payer: Self-pay | Admitting: Internal Medicine

## 2011-07-23 ENCOUNTER — Other Ambulatory Visit: Payer: Self-pay | Admitting: *Deleted

## 2011-07-23 DIAGNOSIS — E042 Nontoxic multinodular goiter: Secondary | ICD-10-CM

## 2011-07-23 DIAGNOSIS — D0511 Intraductal carcinoma in situ of right breast: Secondary | ICD-10-CM | POA: Insufficient documentation

## 2011-07-23 DIAGNOSIS — D0510 Intraductal carcinoma in situ of unspecified breast: Secondary | ICD-10-CM

## 2011-07-23 DIAGNOSIS — C50319 Malignant neoplasm of lower-inner quadrant of unspecified female breast: Secondary | ICD-10-CM | POA: Insufficient documentation

## 2011-07-25 ENCOUNTER — Ambulatory Visit: Payer: BC Managed Care – PPO

## 2011-07-25 ENCOUNTER — Ambulatory Visit (HOSPITAL_BASED_OUTPATIENT_CLINIC_OR_DEPARTMENT_OTHER): Payer: BC Managed Care – PPO | Admitting: Oncology

## 2011-07-25 ENCOUNTER — Other Ambulatory Visit: Payer: BC Managed Care – PPO | Admitting: Lab

## 2011-07-25 VITALS — BP 118/73 | HR 69 | Temp 98.4°F | Ht 64.0 in | Wt 123.2 lb

## 2011-07-25 DIAGNOSIS — D0510 Intraductal carcinoma in situ of unspecified breast: Secondary | ICD-10-CM

## 2011-07-25 DIAGNOSIS — D0590 Unspecified type of carcinoma in situ of unspecified breast: Secondary | ICD-10-CM

## 2011-07-25 LAB — CBC WITH DIFFERENTIAL/PLATELET
BASO%: 0.7 % (ref 0.0–2.0)
HCT: 37.9 % (ref 34.8–46.6)
LYMPH%: 31.1 % (ref 14.0–49.7)
MCH: 31.5 pg (ref 25.1–34.0)
MCHC: 34.1 g/dL (ref 31.5–36.0)
MCV: 92.5 fL (ref 79.5–101.0)
MONO%: 7.9 % (ref 0.0–14.0)
NEUT%: 54.7 % (ref 38.4–76.8)
Platelets: 219 10*3/uL (ref 145–400)
RBC: 4.1 10*6/uL (ref 3.70–5.45)

## 2011-07-25 LAB — COMPREHENSIVE METABOLIC PANEL
ALT: 9 U/L (ref 0–35)
Alkaline Phosphatase: 62 U/L (ref 39–117)
Creatinine, Ser: 0.92 mg/dL (ref 0.50–1.10)
Sodium: 141 mEq/L (ref 135–145)
Total Bilirubin: 0.3 mg/dL (ref 0.3–1.2)
Total Protein: 6.8 g/dL (ref 6.0–8.3)

## 2011-07-25 MED ORDER — MORPHINE SULFATE 15 MG PO TABS: 15.0000 mg | ORAL_TABLET | Freq: Four times a day (QID) | ORAL | Status: AC | PRN

## 2011-07-26 ENCOUNTER — Other Ambulatory Visit: Payer: BC Managed Care – PPO

## 2011-07-29 NOTE — Progress Notes (Signed)
[name] [id number] [birth year] 66 y.o.  CC Dr. Emelia Loron Kari Baars, MD, MD [address] [facility], [place]. [place] [address]  REASON FOR CONSULTATION:  66 year old female with ductal carcinoma in situ of the breast patient is status post bilateral simple mastectomies with sentinel node biopsy with the final pathology revealing a ductal carcinoma in situ. She did undergo bilateral expander placement as well.  STAGE:  Stage 0 (Tis N0)  REFERRING PHYSICIAN: Dr. Emelia Loron  HISTORY OF PRESENT ILLNESS:  [name] is a 66 y.o. female with medical history significant for osteoporosis thyroid disease and gastroesophageal reflux disease. Patient began having screening mammograms at the age of 28. Most recently she had a mammogram performed that showed an abnormality in the left breast in the 6:00 position. Patient went on to have a excision due to inability to be able to do a biopsy percutaneously. Her pathology showed a ductal carcinoma in situ of the left breast. She underwent MRI of the breast that showed no additional abnormalities. She also underwent mammograms that showed numerous residual calcifications. Because of this patient chose to have bilateral mastectomies with immediate reconstruction. On her final pathology specimen patient had no evidence of residual disease. Sentinel node were negative. Oh stop it if the patient is doing well and she is seen in medical oncology today for discussion of any further treatment options. She is without any complaints.   Past Medical History: Past Medical History  Diagnosis Date  . GERD (gastroesophageal reflux disease)   . Osteoporosis   . Thyroid disease   . Enlarged lymph nodes   . Fibromyalgia   . Cancer     left breast dcis  . PONV (postoperative nausea and vomiting)     after ovaries removed..that's the only time  . POTS (postural orthostatic tachycardia syndrome)     saw Dr   Reyes Ivan for this 2-3 yr ago...saw Dr Tommie Sams 2 yr ago....no problems ...    Past Surgical History: Past Surgical History  Procedure Date  . Abdominal hysterectomy 1974  . Bilateral oophorectomy 1999  . Lymph node biopsy 06/20/2011    Procedure: LYMPH NODE BIOPSY;  Surgeon: Emelia Loron, MD;  Location: [facility] OR;  Service: General;  Laterality: Left;  left sentinel lymph node biopsy nuclear medicine injection 30 minutes prior to surgery   . Breast biopsy 04/24/11    left breast biopsy   . Breast surgery     bilateral simple mastectomy, left ax snbx    Family History: Family History  Problem Relation Age of Onset  . Cancer Mother   . Cancer Father     colon  . Cancer Paternal Grandmother     breast    Social History History  Substance Use Topics  . Smoking status: Never Smoker   . Smokeless tobacco: Never Used  . Alcohol Use: No    Allergies: Allergies  Allergen Reactions  . Cyclobenzaprine Hcl Palpitations  . Sertraline Hcl Palpitations and Other (See Comments)    Causes visual sensitivity (especially to particular colors)  . Citalopram Hydrobromide Other (See Comments)    Agitated makes her run red lights  . Propoxyphene N-Acetaminophen Other (See Comments)    unknown  . Alendronate Sodium Nausea And Vomiting  . Atarax (Hydroxyzine Hcl) Palpitations  . Naproxen Hives, Diarrhea and Nausea And Vomiting    Current Medications: Current Outpatient Prescriptions  Medication Sig Dispense Refill  . ALPRAZolam (XANAX) 0.5 MG tablet Take 0.5 mg by mouth  3 (three) times daily.       Marland Kitchen levothyroxine (SYNTHROID, LEVOTHROID) 88 MCG tablet Take 100 mcg by mouth daily.       . methocarbamol (ROBAXIN) 500 MG tablet Take 500 mg by mouth 2 (two) times daily.      . nadolol (CORGARD) 20 MG tablet Take 20 mg by mouth daily. Patient takes 1/2 to 1 pill per day      . zolpidem (AMBIEN) 10 MG tablet Take 10 mg by mouth at bedtime as needed. For sleep      . morphine (MSIR) 15 MG  tablet Take 1 tablet (15 mg total) by mouth every 6 (six) hours as needed for pain (Take 1/2 tablet po q 6 hours prn for pain).  30 tablet  0    OB/GYN History: Menarche at age 76 patient underwent menopause in June 1974. She was on hormone replacement therapy and discontinued them in 2010. Her first pregnancy was at the age of 53 and she has had 2 live births. She began having mammograms in her 4s.  Fertility Discussion: N/A Prior History of Cancer: N/A  ECOG PERFORMANCE STATUS: 0 - Asymptomatic  Genetic Counseling/testing: N/A  REVIEW OF SYSTEMS:  Constitutional: negative Eyes: negative Ears, nose, mouth, throat, and face: negative Respiratory: negative Cardiovascular: negative Gastrointestinal: negative Genitourinary:negative Integument/breast: negative Hematologic/lymphatic: negative Musculoskeletal:negative Neurological: negative  PHYSICAL EXAMINATION: Blood pressure 118/73, pulse 69, temperature 98.4 F (36.9 C), temperature source Oral, height 5\' 4"  (1.626 m), weight 123 lb 3.2 oz (55.883 kg).  ZOX:WRUEA, healthy, no distress, well nourished and well developed SKIN: no rashes or significant lesions HEAD: No masses, lesions, tenderness or abnormalities EYES: PERRLA, EOMI, Conjunctiva are pink and non-injected, sclera clear EARS: External ears normal OROPHARYNX:no exudate, no erythema and lips, buccal mucosa, and tongue normal  NECK: supple, no adenopathy, no bruits, no JVD, thyroid normal size, non-tender, without nodularity LYMPH:  no palpable lymphadenopathy, no hepatosplenomegaly BREAST:surgical scars noted bilateral surgical scars are noted and tissue expanders are in place without evidence of any kind of superficial infection LUNGS: clear to auscultation and percussion HEART: regular rate & rhythm, no murmurs, no gallops, S1 normal and S2 normal ABDOMEN:abdomen soft, non-tender, normal bowel sounds and no masses or organomegaly BACK: Back symmetric, no  curvature., No CVA tenderness EXTREMITIES:no edema, no clubbing, no cyanosis  NEURO: alert & oriented x 3 with fluent speech, no focal motor/sensory deficits, gait normal, reflexes normal and symmetric  STUDIES/RESULTS: Radiology: BILATERAL BREAST MRI WITH AND WITHOUT CONTRAST  Technique: Multiplanar, multisequence MR images of both breasts  were obtained prior to and following the intravenous administration  of 11ml of multihance. Three dimensional images were evaluated at  the independent DynaCad workstation.  Comparison: Mammograms dated 04/24/2011, 03/14/2011 and 01/05/2010  Findings: There is a moderate background parenchymal enhancement  pattern. Far posteriorly in the lateral aspect of the left breast  postoperative changes are present with a postoperative  hematoma/seroma with a rim of surrounding enhancement. The rim of  enhancement is thin and may be postoperative but residual DCIS  cannot be excluded. There is no enlarged axillary or internal  mammary adenopathy.  No abnormal enhancement is seen in the right breast.  IMPRESSION:  Postoperative hematoma/seroma far posteriorly in the left breast  with surrounding enhancement. Upon reviewing the patients needle  localization, it is difficult to tell if all the calcifications  were excised. If the the patient is able to tolerate mammographic  images they may be helpful. The margins of the  lumpectomy was  positive at the deep margin, re-excision should be considered.   LABS:    Chemistry      Component Value Date/Time   NA 141 07/25/2011 1430   K 3.9 07/25/2011 1430   CL 104 07/25/2011 1430   CO2 29 07/25/2011 1430   BUN 12 07/25/2011 1430   CREATININE 0.92 07/25/2011 1430      Component Value Date/Time   CALCIUM 9.7 07/25/2011 1430   ALKPHOS 62 07/25/2011 1430   AST 16 07/25/2011 1430   ALT 9 07/25/2011 1430   BILITOT 0.3 07/25/2011 1430      Lab Results  Component Value Date   WBC 5.3 07/25/2011   HGB 12.9 07/25/2011    HCT 37.9 07/25/2011   MCV 92.5 07/25/2011   PLT 219 07/25/2011       PATHOLOGY: Diagnosis 06/20/11 1. Breast, simple mastectomy, Right - BENIGN BREAST PARENCHYMA WITH FIBROCYSTIC CHANGE AND FIBROADENOMATOID NODULES. SEE COMMENT. - MICROCALCIFICATIONS PRESENT IN BENIGN LOBULES. - NO ATYPIA OR MALIGNANCY PRESENT. 2. Breast, simple mastectomy, Left - BENIGN BREAST PARENCHYMA SEE COMMENT. - NO ATYPIA OR MALIGNANCY PRESENT. - MICROCALCIFICATIONS PRESENT IN BENIGN LOBULES. 3. Lymph node, sentinel, biopsy, Left - ONE LYMPH NODE, NEGATIVE FOR TUMOR (0/1) 4. Lymph node, sentinel, biopsy, Left - ONE LYMPH NODE, NEGATIVE FOR TUMOR (0/1) 5. Lymph node, sentinel, biopsy, Left - ONE LYMPH NODE, NEGATIVE FOR TUMOR (0/1) Microscopic Comment 1. The surgical resection margin(s) of the specimen were inked and microscopically evaluated. 2. The entire previous lumpectomy cavity and surrounding tissue was submitted for review. Within those sections, there is no in situ or invasive carcinoma present. Additional non neoplastic findings include benign fibrocystic change, sclerosing adenosis, fibroadenomatoid nodule   ASSESSMENT  66 year old female with ductal carcinoma in situ of the left breast. Patient states status post bilateral mastectomies with left simple mastectomy and sentinel node sampling. The final pathology on these mastectomy specimen showed benign breast parenchyma no atypia or malignant cells were noted. 3 sentinel nodes were negative for metastatic disease. The right simple mastectomy showed no evidence of malignancy or atypia he 9 breast parenchyma with fibrocystic change and fibromatoid nodules were noted. Patient has had tissue expanders placed ilaterally. And clinically she is doing  PLAN:    #1 patient is now completed her therapy for her early stage noninvasive breast cancer. She at this time does not need any adjuvant endocrine therapy. This is explained very thoroughly to the  patient. I have recommended that patient continued to get good health care through her primary care physician. I have given her the option 2 he does continue to see [place] once a year or just followup with her primary care physician. At this time patient would like to be seen by [place] on a yearly basis.  #2 I have set the patient up to be seen by Colman Cater in the survivor clinic in one years time.     Thank you so much for allowing me to participate in the care of [name]. I will continue to follow up the patient with you and assist in her care.  All questions were answered. The patient knows to call the clinic with any problems, questions or concerns. We can certainly see the patient much sooner if necessary.  I spent 40 minutes counseling the patient face to face. The total time spent in the appointment was 60 minutes.  Drue Second, MD Medical/Oncology [facility] [phone] (beeper) [phone] (Office)

## 2011-07-30 ENCOUNTER — Ambulatory Visit
Admission: RE | Admit: 2011-07-30 | Discharge: 2011-07-30 | Disposition: A | Discharge status: Home / Self Care | Payer: BC Managed Care – PPO | Source: Ambulatory Visit | Attending: Internal Medicine | Admitting: Internal Medicine

## 2011-07-30 DIAGNOSIS — E042 Nontoxic multinodular goiter: Secondary | ICD-10-CM

## 2011-07-31 ENCOUNTER — Telehealth: Payer: Self-pay | Admitting: *Deleted

## 2011-07-31 ENCOUNTER — Encounter: Payer: Self-pay | Admitting: *Deleted

## 2011-07-31 NOTE — Progress Notes (Signed)
Mailed after appt letter to pt. 

## 2011-07-31 NOTE — Telephone Encounter (Signed)
Pt called states " the pharmacy doesn't understand what I'm supposed to take. Dr. Welton Flakes gave me a Rx for morphine and they said they wont fill it until someone calls them. Can you call them and straighten this out?" Reviewed medication with pt. Morphine 15mg  tabletTake 1 tablet (15 mg total) by mouth every 6 (six) hours as needed for pain (Take 1/2 tablet po q 6 hours prn for pain).  Informed pt I will clarify with MD and call Pharmacy- [facility] [place], [place] Ph# [phone]  Per MD- Morphine 15mg  tablet pt to take 1/2 -1 tablet  by mouth every 6(six) hours as needed for pain.  [facility] [phone], spoke with Pharmacist clarified pt's Rx. Requested Pharmacy to call pt when Rx is ready.

## 2011-08-05 ENCOUNTER — Ambulatory Visit (HOSPITAL_COMMUNITY): Payer: BC Managed Care – PPO | Admitting: Oncology

## 2011-08-21 ENCOUNTER — Telehealth (INDEPENDENT_AMBULATORY_CARE_PROVIDER_SITE_OTHER): Payer: Self-pay

## 2011-08-21 NOTE — Telephone Encounter (Signed)
April calling to let us know that the pt has decided not to do physical therapy or go to the ABC class at the [facility] b/c she lives too far away. The pt will call April if she changes her mind.

## 2011-09-03 ENCOUNTER — Other Ambulatory Visit (HOSPITAL_COMMUNITY): Payer: Self-pay | Admitting: *Deleted

## 2011-09-05 ENCOUNTER — Encounter (HOSPITAL_COMMUNITY): Payer: Self-pay

## 2011-09-05 ENCOUNTER — Ambulatory Visit (HOSPITAL_COMMUNITY)
Admission: RE | Admit: 2011-09-05 | Discharge: 2011-09-05 | Disposition: A | Discharge status: Home / Self Care | Payer: BC Managed Care – PPO | Source: Ambulatory Visit | Attending: Internal Medicine | Admitting: Internal Medicine

## 2011-09-05 DIAGNOSIS — M810 Age-related osteoporosis without current pathological fracture: Secondary | ICD-10-CM | POA: Insufficient documentation

## 2011-09-05 MED ORDER — SODIUM CHLORIDE 0.9 % IV SOLN
INTRAVENOUS | Status: AC
  Administered 2011-09-05: 10:00:00 via INTRAVENOUS

## 2011-09-05 MED ORDER — ZOLEDRONIC ACID 5 MG/100ML IV SOLN
5.0000 mg | Freq: Once | INTRAVENOUS | Status: AC
  Administered 2011-09-05: 5 mg via INTRAVENOUS
  Filled 2011-09-05: qty 100

## 2011-09-05 NOTE — Discharge Instructions (Signed)
Continue to drink a lot of fluids the next few days. If aches and pains take Tylenol or whatever you normally would take for pain. Check with your MD about taking your calcium supplements. Take as directed.       RECLAST Zoledronic Acid injection (Paget's Disease, Osteoporosis)  What is this medicine?  ZOLEDRONIC ACID (ZOE le dron ik AS id) lowers the amount of calcium loss from bone. It is used to treat Paget's disease and osteoporosis in women.  This medicine may be used for other purposes; ask your health care provider or pharmacist if you have questions.  What should I tell my health care provider before I take this medicine?  They need to know if you have any of these conditions:  -aspirin-sensitive asthma  -dental disease  -kidney disease  -low levels of calcium in the blood  -past surgery on the parathyroid gland or intestines  -an unusual or allergic reaction to zoledronic acid, other medicines, foods, dyes, or preservatives  -pregnant or trying to get pregnant  -breast-feeding  How should I use this medicine?  This medicine is for infusion into a vein. It is given by a health care professional in a hospital or clinic setting.  Talk to your pediatrician regarding the use of this medicine in children. This medicine is not approved for use in children.  Overdosage: If you think you have taken too much of this medicine contact a poison control center or emergency room at once.  NOTE: This medicine is only for you. Do not share this medicine with others.  What if I miss a dose?  It is important not to miss your dose. Call your doctor or health care professional if you are unable to keep an appointment.  What may interact with this medicine?  -certain antibiotics given by injection  -NSAIDs, medicines for pain and inflammation, like ibuprofen or naproxen  -some diuretics like bumetanide, furosemide  -teriparatide  This list may not describe all possible interactions. Give your  health care provider a list of all the medicines, herbs, non-prescription drugs, or dietary supplements you use. Also tell them if you smoke, drink alcohol, or use illegal drugs. Some items may interact with your medicine.  What should I watch for while using this medicine?  Visit your doctor or health care professional for regular checkups. It may be some time before you see the benefit from this medicine. Do not stop taking your medicine unless your doctor tells you to. Your doctor may order blood tests or other tests to see how you are doing.  Women should inform their doctor if they wish to become pregnant or think they might be pregnant. There is a potential for serious side effects to an unborn child. Talk to your health care professional or pharmacist for more information.  You should make sure that you get enough calcium and vitamin D while you are taking this medicine. Discuss the foods you eat and the vitamins you take with your health care professional.  Some people who take this medicine have severe bone, joint, and/or muscle pain. This medicine may also increase your risk for a broken thigh bone. Tell your doctor right away if you have pain in your upper leg or groin. Tell your doctor if you have any pain that does not go away or that gets worse.  What side effects may I notice from receiving this medicine?  Side effects that you should report to your doctor or health care professional as  soon as possible:  -allergic reactions like skin rash, itching or hives, swelling of the face, lips, or tongue  -breathing problems  -changes in vision  -feeling faint or lightheaded, falls  -jaw burning, cramping, or pain  -muscle cramps, stiffness, or weakness  -trouble passing urine or change in the amount of urine  Side effects that usually do not require medical attention (report to your doctor or health care professional if they continue or are bothersome):  -bone, joint, or muscle pain  -fever    -irritation at site where injected  -loss of appetite  -nausea, vomiting  -stomach upset  -tired  This list may not describe all possible side effects. Call your doctor for medical advice about side effects. You may report side effects to FDA at 1-800-FDA-1088.  Where should I keep my medicine?  This drug is given in a hospital or clinic and will not be stored at home.  NOTE: This sheet is a summary. It may not cover all possible information. If you have questions about this medicine, talk to your doctor, pharmacist, or health care provider.   2012, [employer]. (12/14/2010 9:08:15 AM)

## 2011-09-05 NOTE — Progress Notes (Signed)
Patient received reclast. Tolerated well. Verbalizes understanding of d/c instructions.  Home with husband at 1100.

## 2011-09-30 HISTORY — PX: BREAST RECONSTRUCTION: SHX9

## 2011-12-02 ENCOUNTER — Encounter (INDEPENDENT_AMBULATORY_CARE_PROVIDER_SITE_OTHER): Payer: Self-pay | Admitting: General Surgery

## 2012-01-20 ENCOUNTER — Ambulatory Visit (INDEPENDENT_AMBULATORY_CARE_PROVIDER_SITE_OTHER): Payer: BC Managed Care – PPO | Admitting: General Surgery

## 2012-01-20 ENCOUNTER — Encounter (INDEPENDENT_AMBULATORY_CARE_PROVIDER_SITE_OTHER): Payer: Self-pay | Admitting: General Surgery

## 2012-01-20 VITALS — BP 118/62 | HR 68 | Temp 98.2°F | Resp 16 | Ht 64.5 in | Wt 129.2 lb

## 2012-01-20 DIAGNOSIS — Z853 Personal history of malignant neoplasm of breast: Secondary | ICD-10-CM

## 2012-01-21 ENCOUNTER — Encounter (INDEPENDENT_AMBULATORY_CARE_PROVIDER_SITE_OTHER): Payer: Self-pay | Admitting: General Surgery

## 2012-01-21 NOTE — Progress Notes (Signed)
Subjective:     Patient ID: [name], female   DOB: [birth year], 66 y.o.   MRN: [id number]  HPI 60 yof who underwent bilateral simple mastectomy with left ax snbx for dcis.  She has done well and has her reconstruction complete also.  She reports some occasional pain in the lateral portion of her left breast and her axilla.  She wears a compression sleeve which helps this pain. She does not have any swelling of her arm at all and has good rom.  She reports no concerns about either reconstructed breast or scars currently.  She is doing own exams. She was seen at cancer center and no adjuvant treatment needed.  She comes in today for 6 month follow up  Review of Systems     Objective:   Physical Exam  Vitals reviewed. Constitutional: She appears well-developed and well-nourished.  Pulmonary/Chest: Right breast exhibits no mass. Left breast exhibits no mass.    Lymphadenopathy:    She has no cervical adenopathy.    She has no axillary adenopathy.       Right: No supraclavicular adenopathy present.       Left: No supraclavicular adenopathy present.       Assessment:     Stage 0 breast cancer    Plan:     She needs 6 month exams per NCCN guidelines for now. I told her I would be happy to do those or see her annually if her pcp or gyn sees her at other time.  She will continue own exams.  I wrote her rx for stockings as well as sleeve which seems to help.  Offered appt with pt but she is happy right now.  She is concerned about her thyroid and will see Dr. Clelia Croft again.  I gave her a name in case she requires surgery of one of my partners. I will see back in one year or sooner if needed

## 2012-01-23 ENCOUNTER — Telehealth: Payer: Self-pay | Admitting: *Deleted

## 2012-01-23 NOTE — Telephone Encounter (Signed)
patient confirmed over the phone the same date just a different time of 2:30pm

## 2012-06-17 ENCOUNTER — Telehealth (INDEPENDENT_AMBULATORY_CARE_PROVIDER_SITE_OTHER): Payer: Self-pay | Admitting: General Surgery

## 2012-06-17 NOTE — Telephone Encounter (Signed)
Message copied by Littie Deeds on Wed Jun 17, 2012  3:05 PM ------      Message from: Dwain Sarna, MATTHEW      Created: Wed Jun 17, 2012 10:16 AM       She had bilateral mastectomy?  I don't think she needs mr.  She would have to come in and talk.      ----- Message -----         From: Littie Deeds         Sent: 06/17/2012   9:42 AM           To: Emelia Loron, MD            This pt walked into the breast center wanting an MRI and said that you has told her she needed one at the last visit in 12/2011.  I read the note and did not see anything about an MRI.  Olegario Messier would like to know if you think she needs one, and if so for [place] to put in the order.  Let me know :)            Penni Bombard

## 2012-06-17 NOTE — Telephone Encounter (Signed)
Spoke with pt and she informed me that she doesn't want the MRI, she just thought it was needed.  I explained that it was not but that if she would like an appt to f/u with Dr. Dwain Sarna that we would be happy to do it.  She said no and that she would just follow up at her regular yearly visit.

## 2012-07-24 ENCOUNTER — Ambulatory Visit (HOSPITAL_BASED_OUTPATIENT_CLINIC_OR_DEPARTMENT_OTHER): Payer: BC Managed Care – PPO | Admitting: Oncology

## 2012-07-24 ENCOUNTER — Encounter: Payer: Self-pay | Admitting: Oncology

## 2012-07-24 ENCOUNTER — Telehealth: Payer: Self-pay | Admitting: Oncology

## 2012-07-24 VITALS — BP 151/79 | HR 83 | Temp 97.5°F | Resp 20 | Ht 64.5 in | Wt 126.7 lb

## 2012-07-24 DIAGNOSIS — D0510 Intraductal carcinoma in situ of unspecified breast: Secondary | ICD-10-CM

## 2012-07-24 DIAGNOSIS — D0590 Unspecified type of carcinoma in situ of unspecified breast: Secondary | ICD-10-CM

## 2012-07-24 NOTE — Progress Notes (Signed)
OFFICE PROGRESS NOTE  CC  Kari Baars, MD [address] [facility], [place]. [place] [address] Dr. Emelia Loron   DIAGNOSIS: 67 year old female status post bilateral simple mastectomies with axillary sentinel node biopsy for extensive stage DCIS in 2012.  PRIOR THERAPY:  #1 patient presented in October 2012 with abnormalities on her mammograms. She went on to have a biopsy performed that showed a DCIS.  #2 in December 2012 she underwent simple mastectomy that did not reveal any residual malignancy. She had reconstruction performed.  CURRENT THERAPY:observation  INTERVAL HISTORY: [name] 67 y.o. female returns for followup visit today. Overall she seems to be doing well without any significant complaints. She denies any fevers chills night sweats. She was recently seen by Dr. Emelia Loron on January 17. He felt that she was doing well.  MEDICAL HISTORY: Past Medical History  Diagnosis Date  . GERD (gastroesophageal reflux disease)   . Osteoporosis   . Thyroid disease   . Enlarged lymph nodes   . Fibromyalgia   . Cancer     left breast dcis  . PONV (postoperative nausea and vomiting)     after ovaries removed..that's the only time  . POTS (postural orthostatic tachycardia syndrome)     saw Dr  Reyes Ivan for this 2-3 yr ago...saw Dr Tommie Sams 2 yr ago....no problems ...  . DCIS (ductal carcinoma in situ) of breast     ALLERGIES:  is allergic to cyclobenzaprine hcl; sertraline hcl; citalopram hydrobromide; propoxyphene-acetaminophen; alendronate sodium; atarax; and naproxen.  MEDICATIONS:  Current Outpatient Prescriptions  Medication Sig Dispense Refill  . ALPRAZolam (XANAX) 0.5 MG tablet Take 0.5 mg by mouth 3 (three) times daily.       Marland Kitchen levothyroxine (SYNTHROID, LEVOTHROID) 88 MCG tablet Take 100 mcg by mouth daily.       . nadolol (CORGARD) 20 MG tablet Take 20 mg by mouth daily. Patient takes 1/2 to 1 pill per day      . zolpidem  (AMBIEN) 10 MG tablet Take 10 mg by mouth at bedtime as needed. For sleep      . doxycycline (VIBRAMYCIN) 100 MG capsule as needed.      . methocarbamol (ROBAXIN) 500 MG tablet Take 500 mg by mouth 2 (two) times daily.        SURGICAL HISTORY:  Past Surgical History  Procedure Date  . Abdominal hysterectomy 1974  . Bilateral oophorectomy 1999  . Breast biopsy 04/24/11    left breast biopsy   . Breast surgery     bilateral simple mastectomy, left ax snbx  . Salpingectomy 05/1998    REVIEW OF SYSTEMS:  A comprehensive review of systems was negative.   HEALTH MAINTENANCE:   PHYSICAL EXAMINATION: Blood pressure 151/79, pulse 83, temperature 97.5 F (36.4 C), temperature source Oral, resp. rate 20, height 5' 4.5" (1.638 m), weight 126 lb 11.2 oz (57.471 kg). Body mass index is 21.41 kg/(m^2). ECOG PERFORMANCE STATUS: 0 - Asymptomatic   General appearance: alert, cooperative and appears stated age Lymph nodes: Cervical, supraclavicular, and axillary nodes normal. Resp: clear to auscultation bilaterally Back: symmetric, no curvature. ROM normal. No CVA tenderness. Cardio: regular rate and rhythm GI: soft, non-tender; bowel sounds normal; no masses,  no organomegaly Extremities: extremities normal, atraumatic, no cyanosis or edema Neurologic: Grossly normal   LABORATORY DATA: Lab Results  Component Value Date   WBC 5.3 07/25/2011   HGB 12.9 07/25/2011   HCT 37.9 07/25/2011   MCV 92.5 07/25/2011  PLT 219 07/25/2011      Chemistry      Component Value Date/Time   NA 141 07/25/2011 1430   K 3.9 07/25/2011 1430   CL 104 07/25/2011 1430   CO2 29 07/25/2011 1430   BUN 12 07/25/2011 1430   CREATININE 0.92 07/25/2011 1430      Component Value Date/Time   CALCIUM 9.7 07/25/2011 1430   ALKPHOS 62 07/25/2011 1430   AST 16 07/25/2011 1430   ALT 9 07/25/2011 1430   BILITOT 0.3 07/25/2011 1430       RADIOGRAPHIC STUDIES:  No results found.  ASSESSMENT: 67 year old female status post  bilateral simple mastectomies with axillary sentinel lymph node biopsies. The initial biopsies had shown DCIS of the breast. Final pathology did not show more DCIS and all nodes were negative. She underwent bilateral reconstruction. Overall she is doing well.   PLAN:   #1 at this time patient will continue to be followed on a yearly basis.   All questions were answered. The patient knows to call the clinic with any problems, questions or concerns. We can certainly see the patient much sooner if necessary.  I spent 30 minutes counseling the patient face to face. The total time spent in the appointment was 30 minutes.    Drue Second, MD Medical/Oncology [facility] [phone] (beeper) [phone] (Office)  07/24/2012, 2:12 PM

## 2012-07-24 NOTE — Patient Instructions (Addendum)
Doing well  We will see you back in 1 year 

## 2012-07-24 NOTE — Telephone Encounter (Signed)
gv pt appt schedule for January 2015. °

## 2012-09-30 ENCOUNTER — Other Ambulatory Visit (HOSPITAL_COMMUNITY): Payer: Self-pay | Admitting: Internal Medicine

## 2012-10-02 ENCOUNTER — Encounter (HOSPITAL_COMMUNITY): Payer: Self-pay

## 2012-10-02 ENCOUNTER — Ambulatory Visit (HOSPITAL_COMMUNITY)
Admission: RE | Admit: 2012-10-02 | Discharge: 2012-10-02 | Disposition: A | Discharge status: Home / Self Care | Payer: BC Managed Care – PPO | Source: Ambulatory Visit | Attending: Internal Medicine | Admitting: Internal Medicine

## 2012-10-02 DIAGNOSIS — M810 Age-related osteoporosis without current pathological fracture: Secondary | ICD-10-CM | POA: Insufficient documentation

## 2012-10-02 MED ORDER — ZOLEDRONIC ACID 5 MG/100ML IV SOLN
5.0000 mg | Freq: Once | INTRAVENOUS | Status: AC
  Administered 2012-10-02: 5 mg via INTRAVENOUS
  Filled 2012-10-02: qty 100

## 2012-10-02 MED ORDER — SODIUM CHLORIDE 0.9 % IV SOLN: Freq: Once | INTRAVENOUS | Status: DC

## 2012-11-27 ENCOUNTER — Ambulatory Visit (INDEPENDENT_AMBULATORY_CARE_PROVIDER_SITE_OTHER): Payer: BC Managed Care – PPO | Admitting: General Surgery

## 2012-11-27 ENCOUNTER — Encounter (INDEPENDENT_AMBULATORY_CARE_PROVIDER_SITE_OTHER): Payer: Self-pay | Admitting: General Surgery

## 2012-11-27 DIAGNOSIS — Z853 Personal history of malignant neoplasm of breast: Secondary | ICD-10-CM

## 2012-11-27 NOTE — Progress Notes (Signed)
Subjective:     Patient ID: [name], female   DOB: 02-27-1946, 67 y.o.   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 She is undergone implant based reconstruction with Dr. Odis Luster and is doing very well. She is limited somewhat by her fibromyalgia. She has some left axillary pain at times. This is not preventing her from doing any of her activity and his religious annoying to her. She does not want to pursue any other therapy. She did move her arms well she really does not have any swelling although she does wear compression sleeve at times. She has not been on her breast exam at home as she is concerned about at all right now.  Review of Systems     Objective:   Physical Exam  Vitals reviewed. Constitutional: She appears well-developed and well-nourished.  Pulmonary/Chest: Right breast exhibits no mass. Left breast exhibits no mass.    Lymphadenopathy:    She has no cervical adenopathy.    She has no axillary adenopathy.       Right: No supraclavicular adenopathy present.       Left: No supraclavicular adenopathy present.       Assessment:     Stage 0 breast cancer     Plan:     She has no clinical evidence of recurrence.  She is doing well overall and I will continue to see her annually for an exam.

## 2013-04-14 ENCOUNTER — Telehealth: Payer: Self-pay | Admitting: Internal Medicine

## 2013-04-14 NOTE — Telephone Encounter (Signed)
Spoke with Malachi Bonds and scheduled patient on 04/15/13 at 3:00 PM with Doug Sou, PA. Malachi Bonds to fax records.

## 2013-04-14 NOTE — Telephone Encounter (Signed)
Left a message for [name] to call me. 

## 2013-04-15 ENCOUNTER — Ambulatory Visit: Payer: BC Managed Care – PPO | Admitting: Gastroenterology

## 2013-04-22 ENCOUNTER — Ambulatory Visit (INDEPENDENT_AMBULATORY_CARE_PROVIDER_SITE_OTHER): Payer: BC Managed Care – PPO | Admitting: Gastroenterology

## 2013-04-22 ENCOUNTER — Encounter: Payer: Self-pay | Admitting: Gastroenterology

## 2013-04-22 VITALS — BP 118/80 | HR 90 | Ht 64.0 in | Wt 121.0 lb

## 2013-04-22 DIAGNOSIS — R490 Dysphonia: Secondary | ICD-10-CM | POA: Insufficient documentation

## 2013-04-22 DIAGNOSIS — K219 Gastro-esophageal reflux disease without esophagitis: Secondary | ICD-10-CM

## 2013-04-22 DIAGNOSIS — R1013 Epigastric pain: Secondary | ICD-10-CM | POA: Insufficient documentation

## 2013-04-22 MED ORDER — ESOMEPRAZOLE MAGNESIUM 40 MG PO CPDR: 40.0000 mg | DELAYED_RELEASE_CAPSULE | Freq: Two times a day (BID) | ORAL | Status: DC

## 2013-04-22 NOTE — Progress Notes (Signed)
04/22/2013 [name] [id number] Apr 18, 1946   HISTORY OF PRESENT ILLNESS:  Patient is a pleasant 67 year old female who is known to Dr. Juanda Chance. She has not been seen  since March of 2010 when she had her last EGD. EGD was normal except for some moderate gastritis in the stomach. Biopsy showed benign gastric mucosa with no H. pylori.  She presents to our office today complaining of epigastric abdominal pain that radiates somewhat to both the right and left and also up into her mid chest. She feels like indigestion or something coming up in her throat at times. She complains of hoarseness. She is concerned that this is her gallbladder and states that she had two family members who presented with similar symptoms and they ended up needing their gallbladders out because they were dysfunctional. She had been taking Zantac 300 mg twice daily until she saw her PCP recently. He had given her a prescription for Nexium 40 mg to begin taking twice daily along with Zantac 150 mg twice daily. She thought that was too much medication so she got over-the-counter Nexium 20 mg and began taking that daily with Zantac 150 mg twice daily. Her appetite has not been well due to her symptoms. She says she has probably lost about 6 pounds in the last 6 months.   Past Medical History  Diagnosis Date  . GERD (gastroesophageal reflux disease)   . Osteoporosis   . Thyroid disease   . Enlarged lymph nodes   . Fibromyalgia   . Cancer     left breast dcis  . PONV (postoperative nausea and vomiting)     after ovaries removed..that's the only time  . POTS (postural orthostatic tachycardia syndrome)     saw Dr  Reyes Ivan for this 2-3 yr ago...saw Dr Tommie Sams 2 yr ago....no problems ...  . DCIS (ductal carcinoma in situ) of breast   . Hyperlipidemia    Past Surgical History  Procedure Laterality Date  . Abdominal hysterectomy  1974  . Bilateral oophorectomy  1999  . Breast biopsy  04/24/11    left breast biopsy   .  Breast surgery      bilateral simple mastectomy, left ax snbx  . Salpingectomy  05/1998  . Breast reconstruction  09/2011    reports that she has never smoked. She has never used smokeless tobacco. She reports that she does not drink alcohol or use illicit drugs. family history includes Cancer in her paternal grandmother; Cancer (age of onset: 39) in her mother; Cancer (age of onset: 35) in her father; Cancer (age of onset: 38) in her paternal grandfather. Allergies  Allergen Reactions  . Cyclobenzaprine Hcl Palpitations  . Sertraline Hcl Palpitations and Other (See Comments)    Causes visual sensitivity (especially to particular colors)  . Citalopram Hydrobromide Other (See Comments)    Agitated makes her run red lights  . Propoxyphene-Acetaminophen Other (See Comments)    unknown  . Alendronate Sodium Nausea And Vomiting  . Atarax [Hydroxyzine Hcl] Palpitations  . Naproxen Hives, Diarrhea and Nausea And Vomiting      Outpatient Encounter Prescriptions as of 04/22/2013  Medication Sig Dispense Refill  . ALPRAZolam (XANAX) 0.5 MG tablet Take 0.5 mg by mouth 3 (three) times daily.       . cholecalciferol (VITAMIN D) 1000 UNITS tablet Take 1,000 Units by mouth daily.      Marland Kitchen co-enzyme Q-10 30 MG capsule Take 30 mg by mouth 3 (three) times daily.      Marland Kitchen  levothyroxine (SYNTHROID, LEVOTHROID) 88 MCG tablet Take 100 mcg by mouth daily.       Marland Kitchen zolpidem (AMBIEN) 10 MG tablet Take 10 mg by mouth at bedtime as needed. For sleep      . [DISCONTINUED] esomeprazole (NEXIUM) 20 MG capsule Take 20 mg by mouth daily before breakfast.      . [DISCONTINUED] ranitidine (ZANTAC) 300 MG tablet Take 300 mg by mouth 2 (two) times daily.      Marland Kitchen esomeprazole (NEXIUM) 40 MG capsule Take 1 capsule (40 mg total) by mouth 2 (two) times daily.  60 capsule  0  . [DISCONTINUED] methocarbamol (ROBAXIN) 500 MG tablet Take 500 mg by mouth 2 (two) times daily.       No facility-administered encounter medications on  file as of 04/22/2013.     REVIEW OF SYSTEMS  : All other systems reviewed and negative except where noted in the History of Present Illness.   PHYSICAL EXAM: BP 118/80  Pulse 90  Ht 5\' 4"  (1.626 m)  Wt 121 lb (54.885 kg)  BMI 20.76 kg/m2 General: Well developed white female in no acute distress Head: Normocephalic and atraumatic Eyes:  Sclerae anicteric, conjunctiva pink. Ears: Normal auditory acuity  Lungs: Clear throughout to auscultation Heart: Regular rate and rhythm Abdomen: Soft, non-distended.  BS present.  Epigastric TTP without R/R/G. Musculoskeletal: Symmetrical with no gross deformities  Skin: No lesions on visible extremities Extremities: No edema  Neurological: Alert oriented x 4, grossly nonfocal Psychological:  Alert and cooperative. Normal mood and affect  ASSESSMENT AND PLAN: -Epigastric abdominal pain radiating to the right and left sides and into the chest with complaints of acid coming up into her throat with hoarseness:  This is likely all secondary to reflux. Patient is concerned that its her gallbladder and she says that she had 2 relatives who presented atypically with similar symptoms. We will order an ultrasound of the abdomen. She will take her Nexium 40 mg twice daily as she was directed by her PCP previously. She'll discontinue Zantac for now. We will give her reflux dietary measures.  I will see her back in 2 weeks to reassess. We will decide if she needs HIDA scan versus EGD depending on symptomatic response to PPI therapy.

## 2013-04-22 NOTE — Patient Instructions (Signed)
Take your Nexium 40 mg one tablet by mouth twice a day.  Stop taking Zantac.   You have been given a GERD diet.   You have been scheduled for an abdominal ultrasound at [facility] Radiology (1st floor of hospital) on 04/26/13 at 9:00am. Please arrive 15 minutes prior to your appointment for registration. Make certain not to have anything to eat or drink 6 hours prior to your appointment. Should you need to reschedule your appointment, please contact radiology at [phone]. This test typically takes about 30 minutes to perform.  Please schedule a 2 week follow up appointment with Doug Sou, PA.

## 2013-04-22 NOTE — Progress Notes (Signed)
Reviewed and agree. Follow up  OV in 2 weeks for an update.

## 2013-04-26 ENCOUNTER — Ambulatory Visit (HOSPITAL_COMMUNITY)
Admission: RE | Admit: 2013-04-26 | Discharge: 2013-04-26 | Disposition: A | Discharge status: Home / Self Care | Payer: BC Managed Care – PPO | Source: Ambulatory Visit | Attending: Gastroenterology | Admitting: Gastroenterology

## 2013-04-26 DIAGNOSIS — R1013 Epigastric pain: Secondary | ICD-10-CM

## 2013-04-26 DIAGNOSIS — K7689 Other specified diseases of liver: Secondary | ICD-10-CM | POA: Insufficient documentation

## 2013-04-26 DIAGNOSIS — R109 Unspecified abdominal pain: Secondary | ICD-10-CM | POA: Insufficient documentation

## 2013-04-29 ENCOUNTER — Other Ambulatory Visit: Payer: Self-pay | Admitting: *Deleted

## 2013-04-29 DIAGNOSIS — R109 Unspecified abdominal pain: Secondary | ICD-10-CM

## 2013-04-30 ENCOUNTER — Other Ambulatory Visit: Payer: Self-pay | Admitting: Internal Medicine

## 2013-04-30 DIAGNOSIS — R109 Unspecified abdominal pain: Secondary | ICD-10-CM

## 2013-05-03 ENCOUNTER — Other Ambulatory Visit: Payer: Self-pay | Admitting: Internal Medicine

## 2013-05-03 ENCOUNTER — Ambulatory Visit
Admission: RE | Admit: 2013-05-03 | Discharge: 2013-05-03 | Disposition: A | Discharge status: Home / Self Care | Payer: BC Managed Care – PPO | Source: Ambulatory Visit | Attending: Internal Medicine | Admitting: Internal Medicine

## 2013-05-03 DIAGNOSIS — R109 Unspecified abdominal pain: Secondary | ICD-10-CM

## 2013-05-03 MED ORDER — IOHEXOL 300 MG/ML  SOLN
125.0000 mL | Freq: Once | INTRAMUSCULAR | Status: AC | PRN
  Administered 2013-05-03: 125 mL via INTRAVENOUS

## 2013-05-06 ENCOUNTER — Ambulatory Visit (INDEPENDENT_AMBULATORY_CARE_PROVIDER_SITE_OTHER): Payer: BC Managed Care – PPO | Admitting: Gastroenterology

## 2013-05-06 ENCOUNTER — Encounter: Payer: Self-pay | Admitting: Gastroenterology

## 2013-05-06 ENCOUNTER — Telehealth: Payer: Self-pay | Admitting: *Deleted

## 2013-05-06 VITALS — BP 104/70 | HR 70 | Ht 64.0 in | Wt 120.0 lb

## 2013-05-06 DIAGNOSIS — R1013 Epigastric pain: Secondary | ICD-10-CM

## 2013-05-06 DIAGNOSIS — R490 Dysphonia: Secondary | ICD-10-CM

## 2013-05-06 DIAGNOSIS — K219 Gastro-esophageal reflux disease without esophagitis: Secondary | ICD-10-CM

## 2013-05-06 NOTE — Progress Notes (Signed)
05/06/2013 [name] [id number] 1945-10-18   History of Present Illness:  Patient is a pleasant 67 year old white female who is here for a two-week followup from her last visit. I saw her for epigastric abdominal pain and indigestion/reflux with hoarseness. Please see my last office note for further details. I placed her Nexium 40 mg twice daily, which she states she has been taking religiously since her last visit without any improvement in her symptoms. I also ordered an ultrasound of her abdomen because she was concerned that this may be due to her gallbladder. Ultrasound showed some renal abnormalities, which has been followed up by a CAT scan that was ordered by her PCP, and that study was unremarkable altogether.  I also ordered liver function tests, however she not yet had those drawn.  Now she states that her tongue feels like it is burning at times as well.  She states "I feel like I swallowed a bunch of hot peppers".   Current Medications, Allergies, Past Medical History, Past Surgical History, Family History and Social History were reviewed in [employer] record.   Physical Exam: BP 104/70  Pulse 70  Ht 5\' 4"  (1.626 m)  Wt 120 lb (54.432 kg)  BMI 20.59 kg/m2 General: Well developed white female in no acute distress Head: Normocephalic and atraumatic Eyes:  sclerae anicteric, conjunctiva pink  Ears: Normal auditory acuity Lungs: Clear throughout to auscultation Heart: Regular rate and rhythm Abdomen: Soft, non-distended.  BS present.  Epigastric TTP without R/R/G. Musculoskeletal: Symmetrical with no gross deformities  Extremities: No edema  Neurological: Alert oriented x 4, grossly nonfocal Psychological:  Alert and cooperative. Normal mood and affect  Assessment and Recommendations: -Epigastric abdominal pain radiating to the right and left sides and into the chest with complaints of acid coming up into her throat with hoarseness:  This is  likely all secondary to reflux but she's had no improvement in her symptoms despite Nexium 40 mg BID.  Will schedule for EGD for further evaluation.  In the interim we will give her samples of Zegerid 40 mg to take twice daily.  Can also take Maalox prn as well.  Follow-up LFT's that were ordered previously.  ? If she will need 24-hour pH monitoring or bravo study pending results of the EGD

## 2013-05-06 NOTE — Telephone Encounter (Signed)
Per Doug Sou, PA , patient needs to have LFT drawn. Please, call and ask patient to do this. Left a message for patient to call me.

## 2013-05-06 NOTE — Patient Instructions (Signed)
You have been scheduled for an endoscopy with propofol. Please follow written instructions given to you at your visit today. If you use inhalers (even only as needed), please bring them with you on the day of your procedure. Your physician has requested that you go to www.startemmi.com and enter the access code given to you at your visit today. This web site gives a general overview about your procedure. However, you should still follow specific instructions given to you by our office regarding your preparation for the procedure.   You have been given samples of Zegrid, if this works for you please call and let us know and we will send in a prescription.

## 2013-05-06 NOTE — Progress Notes (Signed)
Reviewed, may be she has bile reflux. May add Carafate slurry 10cc po qid till she has EGD. Then consider Barium esophagram before doing Bravo.

## 2013-05-07 NOTE — Telephone Encounter (Signed)
Message copied by Daphine Deutscher on Fri May 07, 2013 10:14 AM ------      Message from: Doug Sou D      Created: Fri May 07, 2013 10:01 AM      Regarding: Medication       [name],            I saw this patient yesterday and she is scheduled for an EGD with Dr. Juanda Chance in December. I gave her samples of Zegerid to begin taking twice daily. Will you please just call her and let her know to contact us when she is finished with the samples to inform us how she felt while taking them so that we can determine whether to give her more samples or a prescription for this medication or if we need to try something different to help her symptoms in the meantime. Would then recommend adding Carafate slurry 10 cc 4 times a day as the next trial medication, however, I did not obviously want to put her on 2 new medications at the same time.  I don't think that we told her to call back with an update on her symptoms so that is why I am messaging you about this.            Thank you,            Jess ------

## 2013-05-07 NOTE — Telephone Encounter (Signed)
Left a message for patient to call me. 

## 2013-05-11 NOTE — Telephone Encounter (Signed)
Left a message for patient that it is ok to get labs done later this month.

## 2013-05-11 NOTE — Telephone Encounter (Signed)
It is fine for her to wait to have her LFT's drawn later this month.  Thank you,  Jess

## 2013-05-11 NOTE — Telephone Encounter (Signed)
Called patient at her work number. She will call us with update on how she is doing on Zegerid. She has another appointment in [place] on 05/25/13 and wants to know if she can wait until then to get LFT drawn. Is this Ok?

## 2013-05-25 ENCOUNTER — Other Ambulatory Visit (INDEPENDENT_AMBULATORY_CARE_PROVIDER_SITE_OTHER): Payer: BC Managed Care – PPO

## 2013-05-25 DIAGNOSIS — R109 Unspecified abdominal pain: Secondary | ICD-10-CM

## 2013-05-25 LAB — HEPATIC FUNCTION PANEL
ALT: 15 U/L (ref 0–35)
AST: 27 U/L (ref 0–37)
Albumin: 4.3 g/dL (ref 3.5–5.2)
Alkaline Phosphatase: 61 U/L (ref 39–117)
Bilirubin, Direct: 0.1 mg/dL (ref 0.0–0.3)
Total Bilirubin: 0.5 mg/dL (ref 0.3–1.2)
Total Protein: 7.5 g/dL (ref 6.0–8.3)

## 2013-06-10 ENCOUNTER — Other Ambulatory Visit (INDEPENDENT_AMBULATORY_CARE_PROVIDER_SITE_OTHER): Payer: Self-pay | Admitting: *Deleted

## 2013-06-10 MED ORDER — UNABLE TO FIND: Status: DC

## 2013-06-18 ENCOUNTER — Ambulatory Visit (AMBULATORY_SURGERY_CENTER): Payer: BC Managed Care – PPO | Admitting: Internal Medicine

## 2013-06-18 ENCOUNTER — Other Ambulatory Visit (INDEPENDENT_AMBULATORY_CARE_PROVIDER_SITE_OTHER): Payer: Self-pay | Admitting: *Deleted

## 2013-06-18 ENCOUNTER — Encounter: Payer: Self-pay | Admitting: Internal Medicine

## 2013-06-18 VITALS — BP 131/75 | HR 53 | Temp 96.6°F | Resp 26 | Ht 64.0 in | Wt 120.0 lb

## 2013-06-18 DIAGNOSIS — C50919 Malignant neoplasm of unspecified site of unspecified female breast: Secondary | ICD-10-CM

## 2013-06-18 DIAGNOSIS — R1013 Epigastric pain: Secondary | ICD-10-CM

## 2013-06-18 DIAGNOSIS — K2990 Gastroduodenitis, unspecified, without bleeding: Secondary | ICD-10-CM

## 2013-06-18 DIAGNOSIS — K219 Gastro-esophageal reflux disease without esophagitis: Secondary | ICD-10-CM

## 2013-06-18 DIAGNOSIS — K2970 Gastritis, unspecified, without bleeding: Secondary | ICD-10-CM

## 2013-06-18 MED ORDER — UNABLE TO FIND: Status: DC

## 2013-06-18 MED ORDER — SUCRALFATE 1 GM/10ML PO SUSP: 1.0000 g | Freq: Two times a day (BID) | ORAL | Status: DC

## 2013-06-18 MED ORDER — SODIUM CHLORIDE 0.9 % IV SOLN: 500.0000 mL | INTRAVENOUS | Status: DC

## 2013-06-18 MED ORDER — HYOSCYAMINE SULFATE 0.125 MG SL SUBL: 0.1250 mg | SUBLINGUAL_TABLET | SUBLINGUAL | Status: DC | PRN

## 2013-06-18 NOTE — Progress Notes (Signed)
Called to room to assist during endoscopic procedure.  Patient ID and intended procedure confirmed with present staff. Received instructions for my participation in the procedure from the performing physician.  

## 2013-06-18 NOTE — Op Note (Signed)
[facility] [address] [place], [address]   ENDOSCOPY PROCEDURE REPORT  PATIENT: Tiffany, Clarke  MR#: [id number] BIRTHDATE: 1946/03/12 , 67  yrs. old GENDER: Female ENDOSCOPIST: Hart Carwin, MD REFERRED BY:  Roseanne Kaufman, M.D. PROCEDURE DATE:  06/18/2013 PROCEDURE:  EGD w/ biopsy ASA CLASS:     Class II INDICATIONS:  history of gastroesophageal reflux refractory to Nexium 40 mg twice a day.prior upper endoscopy in September 2006 and in March 2010 showing mild gastritis. Upper abdominal ultrasound showed common bile duct of 6.1 mm he has been on Zegrid and Maalox MEDICATIONS: MAC sedation, administered by CRNA and propofol (Diprivan) 100mg  IV TOPICAL ANESTHETIC: Cetacaine Spray  DESCRIPTION OF PROCEDURE: After the risks benefits and alternatives of the procedure were thoroughly explained, informed consent was obtained.  The LB [id number] and LB [id number] endoscope was introduced through the mouth and advanced to the second portion of the duodenum. Without limitations.  The instrument was slowly withdrawn as the mucosa was fully examined.      Esophagus: esophageal mucosa was normal through the proximal mid and distal esophagus. Z line was ,there was regular, no esophagitis or hiatal hernia. Stomach: Gastric mucosa was mildly erythematous in the gastric antrum. Biopsies were taken for H. pylori. Gastric outlet was normal. Retroflexion of the endoscope revealed normal fundus and cardia Duodenum: Duodenal bulb and descending duodenum was normal The scope was then withdrawn from the patient and the procedure completed.  COMPLICATIONS: There were no complications. ENDOSCOPIC IMPRESSION:  essentially normal upper endoscopy of the esophagus stomach and duodenum with minimal erythema in gastric antrum. Status post biopsies to rule out H. pylori RECOMMENDATIONS: 1.  Await pathology results 2.  continue PPI 3. 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  eSigned:  Hart Carwin, MD 06/18/2013 4:54 PM   CC:

## 2013-06-18 NOTE — Progress Notes (Signed)
Report to pacu rn, vss, bbs=clear 

## 2013-06-18 NOTE — Patient Instructions (Signed)
Discharge instructions given with verbal understanding. Biopsies taken. Resume previous medications. YOU HAD AN ENDOSCOPIC PROCEDURE TODAY AT THE [facility]: Refer to the procedure report that was given to you for any specific questions about what was found during the examination.  If the procedure report does not answer your questions, please call your gastroenterologist to clarify.  If you requested that your care partner not be given the details of your procedure findings, then the procedure report has been included in a sealed envelope for you to review at your convenience later.  YOU SHOULD EXPECT: Some feelings of bloating in the abdomen. Passage of more gas than usual.  Walking can help get rid of the air that was put into your GI tract during the procedure and reduce the bloating. If you had a lower endoscopy (such as a colonoscopy or flexible sigmoidoscopy) you may notice spotting of blood in your stool or on the toilet paper. If you underwent a bowel prep for your procedure, then you may not have a normal bowel movement for a few days.  DIET: Your first meal following the procedure should be a light meal and then it is ok to progress to your normal diet.  A half-sandwich or bowl of soup is an example of a good first meal.  Heavy or fried foods are harder to digest and may make you feel nauseous or bloated.  Likewise meals heavy in dairy and vegetables can cause extra gas to form and this can also increase the bloating.  Drink plenty of fluids but you should avoid alcoholic beverages for 24 hours.  ACTIVITY: Your care partner should take you home directly after the procedure.  You should plan to take it easy, moving slowly for the rest of the day.  You can resume normal activity the day after the procedure however you should NOT DRIVE or use heavy machinery for 24 hours (because of the sedation medicines used during the test).    SYMPTOMS TO REPORT IMMEDIATELY: A gastroenterologist  can be reached at any hour.  During normal business hours, 8:30 AM to 5:00 PM Monday through Friday, call [phone].  After hours and on weekends, please call the GI answering service at [phone] who will take a message and have the physician on call contact you.   Following upper endoscopy (EGD)  Vomiting of blood or coffee ground material  New chest pain or pain under the shoulder blades  Painful or persistently difficult swallowing  New shortness of breath  Fever of 100F or higher  Black, tarry-looking stools  FOLLOW UP: If any biopsies were taken you will be contacted by phone or by letter within the next 1-3 weeks.  Call your gastroenterologist if you have not heard about the biopsies in 3 weeks.  Our staff will call the home number listed on your records the next business day following your procedure to check on you and address any questions or concerns that you may have at that time regarding the information given to you following your procedure. This is a courtesy call and so if there is no answer at the home number and we have not heard from you through the emergency physician on call, we will assume that you have returned to your regular daily activities without incident.  SIGNATURES/CONFIDENTIALITY: You and/or your care partner have signed paperwork which will be entered into your electronic medical record.  These signatures attest to the fact that that the information above on your After   Visit Summary has been reviewed and is understood.  Full responsibility of the confidentiality of this discharge information lies with you and/or your care-partner. 

## 2013-06-18 NOTE — Progress Notes (Signed)
Patient did not experience any of the following events: a burn prior to discharge; a fall within the facility; wrong site/side/patient/procedure/implant event; or a hospital transfer or hospital admission upon discharge from the facility. (G8907) Patient did not have preoperative order for IV antibiotic SSI prophylaxis. (G8918)  

## 2013-06-21 ENCOUNTER — Telehealth: Payer: Self-pay | Admitting: *Deleted

## 2013-06-21 NOTE — Telephone Encounter (Signed)
Called pt's work number as directed per pt on Friday and is a pharmacy at [facility] that is closed and could not leave message. ewm

## 2013-06-25 ENCOUNTER — Encounter: Payer: Self-pay | Admitting: Internal Medicine

## 2013-07-22 ENCOUNTER — Encounter: Payer: Self-pay | Admitting: Oncology

## 2013-07-22 ENCOUNTER — Telehealth: Payer: Self-pay | Admitting: Oncology

## 2013-07-22 ENCOUNTER — Ambulatory Visit (HOSPITAL_BASED_OUTPATIENT_CLINIC_OR_DEPARTMENT_OTHER): Payer: BLUE CROSS/BLUE SHIELD | Admitting: Oncology

## 2013-07-22 ENCOUNTER — Other Ambulatory Visit (HOSPITAL_BASED_OUTPATIENT_CLINIC_OR_DEPARTMENT_OTHER): Payer: Medicare Other

## 2013-07-22 VITALS — BP 120/79 | HR 96 | Temp 98.8°F | Resp 18 | Ht 64.0 in | Wt 117.5 lb

## 2013-07-22 DIAGNOSIS — D0590 Unspecified type of carcinoma in situ of unspecified breast: Secondary | ICD-10-CM

## 2013-07-22 DIAGNOSIS — D0510 Intraductal carcinoma in situ of unspecified breast: Secondary | ICD-10-CM

## 2013-07-22 DIAGNOSIS — Z87898 Personal history of other specified conditions: Secondary | ICD-10-CM

## 2013-07-22 LAB — CBC WITH DIFFERENTIAL/PLATELET
BASO%: 0.3 % (ref 0.0–2.0)
Basophils Absolute: 0 10*3/uL (ref 0.0–0.1)
EOS ABS: 0.1 10*3/uL (ref 0.0–0.5)
EOS%: 1.4 % (ref 0.0–7.0)
HEMATOCRIT: 44.9 % (ref 34.8–46.6)
HEMOGLOBIN: 14.6 g/dL (ref 11.6–15.9)
LYMPH#: 1.9 10*3/uL (ref 0.9–3.3)
LYMPH%: 27.4 % (ref 14.0–49.7)
MCH: 30.2 pg (ref 25.1–34.0)
MCHC: 32.5 g/dL (ref 31.5–36.0)
MCV: 93 fL (ref 79.5–101.0)
MONO#: 0.6 10*3/uL (ref 0.1–0.9)
MONO%: 8.6 % (ref 0.0–14.0)
NEUT%: 62.3 % (ref 38.4–76.8)
NEUTROS ABS: 4.4 10*3/uL (ref 1.5–6.5)
PLATELETS: 268 10*3/uL (ref 145–400)
RBC: 4.83 10*6/uL (ref 3.70–5.45)
RDW: 13.1 % (ref 11.2–14.5)
WBC: 7 10*3/uL (ref 3.9–10.3)

## 2013-07-22 LAB — COMPREHENSIVE METABOLIC PANEL (CC13)
ALT: 11 U/L (ref 0–55)
ANION GAP: 10 meq/L (ref 3–11)
AST: 17 U/L (ref 5–34)
Albumin: 4.4 g/dL (ref 3.5–5.0)
Alkaline Phosphatase: 64 U/L (ref 40–150)
BUN: 13.7 mg/dL (ref 7.0–26.0)
CALCIUM: 9.7 mg/dL (ref 8.4–10.4)
CHLORIDE: 103 meq/L (ref 98–109)
CO2: 26 meq/L (ref 22–29)
CREATININE: 1.1 mg/dL (ref 0.6–1.1)
GLUCOSE: 95 mg/dL (ref 70–140)
Potassium: 4 mEq/L (ref 3.5–5.1)
SODIUM: 139 meq/L (ref 136–145)
TOTAL PROTEIN: 7.6 g/dL (ref 6.4–8.3)
Total Bilirubin: 0.57 mg/dL (ref 0.20–1.20)

## 2013-07-22 MED ORDER — LORAZEPAM 0.5 MG PO TABS: 0.5000 mg | ORAL_TABLET | Freq: Three times a day (TID) | ORAL | Status: AC

## 2013-07-22 NOTE — Progress Notes (Signed)
OFFICE PROGRESS NOTE  CC  Tiffany Rouse, MD [place], [place]. [place] [address] Dr. Rolm Bookbinder   DIAGNOSIS: 68 year old female status post bilateral simple mastectomies with axillary sentinel node biopsy for extensive stage DCIS in 2012.  PRIOR THERAPY:  #1 patient presented in October 2012 with abnormalities on her mammograms. She went on to have a biopsy performed that showed a DCIS.  #2 in December 2012 she underwent simple mastectomy that did not reveal any residual malignancy. She had reconstruction performed.  CURRENT THERAPY:observation  INTERVAL HISTORY: [name] 68 y.o. female returns for followup visit today. Overall she seems to be doing well without any significant complaints. She denies any fevers chills night sweats. She was recently seen by Dr. Rolm Bookbinder on January 17. He felt that she was doing well.  MEDICAL HISTORY: Past Medical History  Diagnosis Date  . GERD (gastroesophageal reflux disease)   . Osteoporosis   . Thyroid disease   . Enlarged lymph nodes   . Fibromyalgia   . Cancer     left breast dcis  . PONV (postoperative nausea and vomiting)     after ovaries removed..that's the only time  . POTS (postural orthostatic tachycardia syndrome)     saw Dr  Verlon Setting for this 2-3 yr ago...saw Dr Clearnce Hasten 2 yr ago....no problems ...  . DCIS (ductal carcinoma in situ) of breast   . Hyperlipidemia     ALLERGIES:  is allergic to cyclobenzaprine hcl; sertraline hcl; citalopram hydrobromide; propoxyphene n-acetaminophen; alendronate sodium; atarax; and naproxen.  MEDICATIONS:  Current Outpatient Prescriptions  Medication Sig Dispense Refill  . ALPRAZolam (XANAX) 0.5 MG tablet Take 0.5 mg by mouth as needed.       . cholecalciferol (VITAMIN D) 1000 UNITS tablet Take 1,000 Units by mouth daily.      Marland Kitchen doxycycline (VIBRA-TABS) 100 MG tablet       . esomeprazole (NEXIUM) 40 MG capsule Take 1 capsule (40  mg total) by mouth 2 (two) times daily.  60 capsule  0  . hyoscyamine (LEVSIN SL) 0.125 MG SL tablet Place 1 tablet (0.125 mg total) under the tongue every 4 (four) hours as needed (abdominal pain.).  30 tablet  1  . levothyroxine (SYNTHROID, LEVOTHROID) 88 MCG tablet Take 100 mcg by mouth daily.       . sucralfate (CARAFATE) 1 GM/10ML suspension Take 10 mLs (1 g total) by mouth 2 (two) times daily.  480 mL  1  . UNABLE TO FIND Rx: D6387- Post Mastectomy Camisole (Quantity: 2) L8000- Post Surgical Bras (Quantity: 6) Dx: 174.9; Bilateral mastectomy  1 each  0  . UNABLE TO FIND Rx: F6433- Silicone Breast Prosthesis (Quantity: 2) Dx: 174.9; Bilateral mastectomy  1 each  0  . zolpidem (AMBIEN) 10 MG tablet Take 10 mg by mouth at bedtime as needed. For sleep       No current facility-administered medications for this visit.    SURGICAL HISTORY:  Past Surgical History  Procedure Laterality Date  . Abdominal hysterectomy  1974  . Bilateral oophorectomy  1999  . Breast biopsy  04/24/11    left breast biopsy   . Breast surgery      bilateral simple mastectomy, left ax snbx  . Salpingectomy  05/1998  . Breast reconstruction  09/2011    REVIEW OF SYSTEMS:  A comprehensive review of systems was negative.   HEALTH MAINTENANCE:   PHYSICAL EXAMINATION: Blood pressure 120/79, pulse 96, temperature 98.8 F (  37.1 C), temperature source Oral, resp. rate 18, height 5\' 4"  (1.626 m), weight 117 lb 8 oz (53.298 kg). Body mass index is 20.16 kg/(m^2). ECOG PERFORMANCE STATUS: 0 - Asymptomatic   General appearance: alert, cooperative and appears stated age Lymph nodes: Cervical, supraclavicular, and axillary nodes normal. Resp: clear to auscultation bilaterally Back: symmetric, no curvature. ROM normal. No CVA tenderness. Cardio: regular rate and rhythm GI: soft, non-tender; bowel sounds normal; no masses,  no organomegaly Extremities: extremities normal, atraumatic, no cyanosis or  edema Neurologic: Grossly normal   LABORATORY DATA: Lab Results  Component Value Date   WBC 7.0 07/22/2013   HGB 14.6 07/22/2013   HCT 44.9 07/22/2013   MCV 93.0 07/22/2013   PLT 268 07/22/2013      Chemistry      Component Value Date/Time   NA 139 07/22/2013 1120   NA 141 07/25/2011 1430   K 4.0 07/22/2013 1120   K 3.9 07/25/2011 1430   CL 104 07/25/2011 1430   CO2 26 07/22/2013 1120   CO2 29 07/25/2011 1430   BUN 13.7 07/22/2013 1120   BUN 10 05/03/2013 0817   CREATININE 1.1 07/22/2013 1120   CREATININE 0.90 05/03/2013 0817   CREATININE 0.92 07/25/2011 1430      Component Value Date/Time   CALCIUM 9.7 07/22/2013 1120   CALCIUM 9.7 07/25/2011 1430   ALKPHOS 64 07/22/2013 1120   ALKPHOS 61 05/25/2013 0937   AST 17 07/22/2013 1120   AST 27 05/25/2013 0937   ALT 11 07/22/2013 1120   ALT 15 05/25/2013 0937   BILITOT 0.57 07/22/2013 1120   BILITOT 0.5 05/25/2013 0937       RADIOGRAPHIC STUDIES:  No results found.  ASSESSMENT: 68 year old female status post bilateral simple mastectomies with axillary sentinel lymph node biopsies. The initial biopsies had shown DCIS of the breast. Final pathology did not show more DCIS and all nodes were negative. She underwent bilateral reconstruction. Overall she is doing well.   PLAN:   #1 at this time patient will continue to be followed on a yearly basis.   All questions were answered. The patient knows to call the clinic with any problems, questions or concerns. We can certainly see the patient much sooner if necessary.  I spent 20 minutes counseling the patient face to face. The total time spent in the appointment was 30 minutes.    Marcy Panning, MD Medical/Oncology [facility] [phone] (beeper) [phone] (Office)  07/22/2013, 1:25 PM

## 2013-07-26 ENCOUNTER — Other Ambulatory Visit (INDEPENDENT_AMBULATORY_CARE_PROVIDER_SITE_OTHER): Payer: Self-pay | Admitting: *Deleted

## 2013-07-26 MED ORDER — UNABLE TO FIND: Status: DC

## 2013-12-23 ENCOUNTER — Encounter (INDEPENDENT_AMBULATORY_CARE_PROVIDER_SITE_OTHER): Payer: Self-pay | Admitting: General Surgery

## 2014-02-03 ENCOUNTER — Encounter (INDEPENDENT_AMBULATORY_CARE_PROVIDER_SITE_OTHER): Payer: Self-pay | Admitting: General Surgery

## 2014-02-03 ENCOUNTER — Ambulatory Visit (INDEPENDENT_AMBULATORY_CARE_PROVIDER_SITE_OTHER): Payer: Medicare Other | Admitting: General Surgery

## 2014-02-03 VITALS — BP 122/78 | HR 64 | Temp 97.8°F | Ht 64.0 in | Wt 120.0 lb

## 2014-02-03 DIAGNOSIS — D0512 Intraductal carcinoma in situ of left breast: Secondary | ICD-10-CM

## 2014-02-03 DIAGNOSIS — D0590 Unspecified type of carcinoma in situ of unspecified breast: Secondary | ICD-10-CM

## 2014-02-03 NOTE — Progress Notes (Signed)
Subjective:     Patient ID: [name], female   DOB: 1946/04/16, 68 y.o.   MRN: [id number]  HPI This is a 68 year old female who underwent bilateral mastectomies with a left axillary sentinel lymph node biopsy in December of 2012 for ductal carcinoma in situ. She is undergone implant based reconstruction with Dr. Harlow Mares and is doing very well. She is limited somewhat by her fibromyalgia. She has some left axillary pain still that is a little worse than last year.  She also has some occasional swelling in her axilla.  She wears a sleeve at times in the winter but not in the summer due to fact it is uncomfortable. She is overall well and has not had any significant events.   Review of Systems     Objective:   Physical Exam Physical Exam  Vitals reviewed.  Constitutional: She appears well-developed and well-nourished.  Pulmonary/Chest: Right breast exhibits no mass. Left breast exhibits no mass.    Lymphadenopathy:  She has no cervical adenopathy.  She has no axillary adenopathy.  Right: No supraclavicular adenopathy present.  Left: No supraclavicular adenopathy present.     Assessment:     Stage 0 breast cancer     Plan:     She has no clinical evidence of recurrence. She is doing well overall and I will continue to see her annually for an exam.  I offered her to see physical therapy as I think this would help her arm issues. She says she will think about it and does not have time due to work right now.  She will call back if she decides to see them.

## 2014-02-15 ENCOUNTER — Other Ambulatory Visit: Payer: Self-pay | Admitting: Internal Medicine

## 2014-02-15 DIAGNOSIS — R1032 Left lower quadrant pain: Secondary | ICD-10-CM

## 2014-02-18 ENCOUNTER — Ambulatory Visit
Admission: RE | Admit: 2014-02-18 | Discharge: 2014-02-18 | Disposition: A | Discharge status: Home / Self Care | Payer: Medicare Other | Source: Ambulatory Visit | Attending: Internal Medicine | Admitting: Internal Medicine

## 2014-02-18 DIAGNOSIS — R1032 Left lower quadrant pain: Secondary | ICD-10-CM

## 2014-02-18 MED ORDER — IOHEXOL 300 MG/ML  SOLN
30.0000 mL | Freq: Once | INTRAMUSCULAR | Status: AC | PRN
  Administered 2014-02-18: 30 mL via ORAL

## 2014-02-18 MED ORDER — IOHEXOL 300 MG/ML  SOLN
100.0000 mL | Freq: Once | INTRAMUSCULAR | Status: AC | PRN
  Administered 2014-02-18: 100 mL via INTRAVENOUS

## 2014-03-02 ENCOUNTER — Telehealth: Payer: Self-pay | Admitting: Internal Medicine

## 2014-03-02 NOTE — Telephone Encounter (Signed)
Note not needed, pt has appt

## 2014-03-04 ENCOUNTER — Ambulatory Visit (INDEPENDENT_AMBULATORY_CARE_PROVIDER_SITE_OTHER): Payer: Medicare Other | Admitting: Gastroenterology

## 2014-03-04 ENCOUNTER — Encounter: Payer: Self-pay | Admitting: Gastroenterology

## 2014-03-04 VITALS — BP 118/70 | HR 64 | Ht 64.0 in | Wt 118.0 lb

## 2014-03-04 DIAGNOSIS — R1032 Left lower quadrant pain: Secondary | ICD-10-CM

## 2014-03-04 DIAGNOSIS — K5900 Constipation, unspecified: Secondary | ICD-10-CM | POA: Insufficient documentation

## 2014-03-04 MED ORDER — HYOSCYAMINE SULFATE 0.125 MG SL SUBL: 0.1250 mg | SUBLINGUAL_TABLET | SUBLINGUAL | Status: DC | PRN

## 2014-03-04 MED ORDER — MOVIPREP 100 G PO SOLR: 1.0000 | Freq: Once | ORAL | Status: DC

## 2014-03-04 NOTE — Patient Instructions (Signed)
You have been scheduled for a colonoscopy. Please follow written instructions given to you at your visit today.  Please pick up your prep kit at the pharmacy within the next 1-3 days. If you use inhalers (even only as needed), please bring them with you on the day of your procedure. Your physician has requested that you go to www.startemmi.com and enter the access code given to you at your visit today. This web site gives a general overview about your procedure. However, you should still follow specific instructions given to you by our office regarding your preparation for the procedure.  We have sent the following medications to your pharmacy for you to pick up at your convenience: Levsin

## 2014-03-04 NOTE — Progress Notes (Signed)
03/04/2014 [name] [id number] Feb 29, 1946   History of Present Illness:  This is a 68 year old female who is known to Dr. Olevia Perches.  She was seen previously and evaluated for complaints of upper abdominal pain.  Those pains have since resolved, however, she now presents to the office today with complaints of left sided/left lower quadrant abdominal pain. She tells me that this pain has been present for more than a year, however, over the past 6 weeks it has become very severe. It is on her left side, almost at the left of her umbilicus. She says that the pain is constant dull pain, however, it becomes very severe throughout the day while she is standing etc at work. It has become severe enough that it has caused her to have to leave work. She says that when the pain is very severe it makes her skin in that area even sensitive to touch. States that the becomes somewhat bulged and swollen by the end of the day. It does feel better if she lies on her left side. The pain is not affected by bowel movements or eating. She had a CT scan of the abdomen and pelvis with contrast on 8/21, which showed a large amount of stool noted throughout the colon possibly representing constipation but no other significant abnormalities were seen in the abdomen and pelvis. She states that she does take Colace and Miralax daily and does move her bowels on a regular basis.  She denies seeing blood in her stool. Her last colonoscopy was in September 2006 by Dr. Olevia Perches at which time the study was normal and it was recommended that she have repeat in 10 years from that time. She still has Levsin listed on her medication list, however, she says that she no longer has the medication at home.  She took it in the past for her upper abdominal pain, though she has not tried it for this pain in particular. She is taking Ativan which has been given to her by her PCP, which does help the pain to some degree.  CBC was normal  recently.   Current Medications, Allergies, Past Medical History, Past Surgical History, Family History and Social History were reviewed in [employer] record.   Physical Exam: BP 118/70  Pulse 64  Ht 5\' 4"  (1.626 m)  Wt 118 lb (53.524 kg)  BMI 20.24 kg/m2 General: Well developed white female in no acute distress Head: Normocephalic and atraumatic Eyes:  Sclerae anicteric, conjunctiva pink  Ears: Normal auditory acuity Lungs: Clear throughout to auscultation Heart: Regular rate and rhythm Abdomen: Soft, non-distended.  Normal bowel sounds.  Pinpoint TTP in one area to the left of the umbilicus. Rectal:  Deferred.  Will be done at the time of colonoscopy. Musculoskeletal: Symmetrical with no gross deformities  Extremities: No edema  Neurological: Alert oriented x 4, grossly non-focal Psychological:  Alert and cooperative. Normal mood and affect  Assessment and Recommendations: -LLQ abdominal pain:  Unsure if this is GI related.  She had constipation on CT scan, but patient says that she moves her bowels regularly.  Pain is worsened by standing/movement and not affected by eating or BM's.  ? Musculoskeletal vs nerve pain or if she has scar tissue/adhesive disease or an internal hernia.  Will schedule colonoscopy for completion of GI evaluation.  The risks, benefits, and alternatives were discussed with the patient and she consents to proceed.  Will try levsin prn for now as  well; new prescription sent.

## 2014-03-05 NOTE — Progress Notes (Signed)
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

## 2014-03-08 ENCOUNTER — Telehealth: Payer: Self-pay | Admitting: *Deleted

## 2014-03-08 MED ORDER — DICYCLOMINE HCL 10 MG PO CAPS: 10.0000 mg | ORAL_CAPSULE | Freq: Three times a day (TID) | ORAL | Status: DC

## 2014-03-08 NOTE — Telephone Encounter (Signed)
Received via fax from [facility] is too expensive for patient. Per Janett Billow we can send Bentyl, only other antispasmodic we can send. Sent Bentyl.  Per pharmacy Moviprep is too expensive Faxed back patient can pick up coupon for Moviprep

## 2014-03-14 ENCOUNTER — Ambulatory Visit (AMBULATORY_SURGERY_CENTER): Payer: Medicare Other | Admitting: Internal Medicine

## 2014-03-14 ENCOUNTER — Other Ambulatory Visit: Payer: Self-pay | Admitting: Internal Medicine

## 2014-03-14 ENCOUNTER — Encounter: Payer: Self-pay | Admitting: Internal Medicine

## 2014-03-14 ENCOUNTER — Other Ambulatory Visit (INDEPENDENT_AMBULATORY_CARE_PROVIDER_SITE_OTHER): Payer: Medicare Other

## 2014-03-14 VITALS — BP 152/74 | HR 59 | Temp 96.5°F | Resp 13 | Ht 64.0 in | Wt 118.0 lb

## 2014-03-14 DIAGNOSIS — D126 Benign neoplasm of colon, unspecified: Secondary | ICD-10-CM

## 2014-03-14 DIAGNOSIS — R1032 Left lower quadrant pain: Secondary | ICD-10-CM

## 2014-03-14 DIAGNOSIS — K5900 Constipation, unspecified: Secondary | ICD-10-CM

## 2014-03-14 LAB — CREATININE, SERUM: CREATININE: 0.8 mg/dL (ref 0.4–1.2)

## 2014-03-14 LAB — BUN: BUN: 4 mg/dL — AB (ref 6–23)

## 2014-03-14 MED ORDER — TRAMADOL HCL 50 MG PO TABS: 50.0000 mg | ORAL_TABLET | Freq: Four times a day (QID) | ORAL | Status: DC | PRN

## 2014-03-14 MED ORDER — SODIUM CHLORIDE 0.9 % IV SOLN: 500.0000 mL | INTRAVENOUS | Status: DC

## 2014-03-14 MED ORDER — METHYLPREDNISOLONE (PAK) 4 MG PO TABS: ORAL_TABLET | ORAL | Status: DC

## 2014-03-14 NOTE — Op Note (Signed)
[place]  [address]. [place], [address]   COLONOSCOPY PROCEDURE REPORT  PATIENT: [name], [name]  MR#: [id number] BIRTHDATE: [birth year] , 46  yrs. old GENDER: Female ENDOSCOPIST: Lafayette Dragon, MD REFERRED BY:W.  Lutricia Feil, M.D. PROCEDURE DATE:  03/14/2014 PROCEDURE:   Colonoscopy with cold biopsy polypectomy First Screening Colonoscopy - Avg.  risk and is 50 yrs.  old or older - No.  Prior Negative Screening - Now for repeat screening. Other: See Comments  History of Adenoma - Now for follow-up colonoscopy & has been > or = to 3 yrs.  N/A  Polyps Removed Today? Yes. ASA CLASS:   Class II INDICATIONS:prior colonoscopy in September 2006 was normal.  Left lower quadrant abdominal pain.  Negative CT scan of the abdomen with exception of increased stool content.Marland Kitchen MEDICATIONS: MAC sedation, administered by CRNA and propofol (Diprivan) 250mg  IV  DESCRIPTION OF PROCEDURE:   After the risks benefits and alternatives of the procedure were thoroughly explained, informed consent was obtained.  A digital rectal exam revealed no abnormalities of the rectum.   The LB PCF Q180 [id number]  endoscope was introduced through the anus and advanced to the cecum, which was identified by both the appendix and ileocecal valve. No adverse events experienced.   The quality of the prep was good, using MoviPrep  The instrument was then slowly withdrawn as the colon was fully examined.      COLON FINDINGS: A sessile polyp ranging between 3-35mm in size was found in the descending colon.  A polypectomy was performed with cold forceps.  The resection was complete and the polyp tissue was completely retrieved.   Melanosis coli Mild diverticulosis of the left colon Small internal hemorrhoids Nothing to account for patient's symptoms of left lower quadrant abdominal pain.  Retroflexed views revealed no abnormalities. The time to cecum=5 minutes 38 seconds.  Withdrawal time=11  minutes 42 seconds.  The scope was withdrawn and the procedure completed. COMPLICATIONS: There were no complications.  ENDOSCOPIC IMPRESSION: 1.   Sessile polyp ranging between 3-62mm in size was found in the descending colon; polypectomy was performed with cold forceps 2.   Melanosis coli 3. Mild diverticulosis of the left colon 4. Small internal hemorrhoids Nothing to account for patient's symptoms of left lower quadrant abdominal pain  RECOMMENDATIONS: 1.  Await pathology results 2.  high fiber diet Recall colonoscopy pending path report   eSigned:  Lafayette Dragon, MD 03/14/2014 9:44 AM   cc:   PATIENT NAME:  Callahan, Peddie MR#: [id number]

## 2014-03-14 NOTE — Patient Instructions (Signed)
YOU HAD AN ENDOSCOPIC PROCEDURE TODAY AT THE [facility]: Refer to the procedure report that was given to you for any specific questions about what was found during the examination.  If the procedure report does not answer your questions, please call your gastroenterologist to clarify.  If you requested that your care partner not be given the details of your procedure findings, then the procedure report has been included in a sealed envelope for you to review at your convenience later.  YOU SHOULD EXPECT: Some feelings of bloating in the abdomen. Passage of more gas than usual.  Walking can help get rid of the air that was put into your GI tract during the procedure and reduce the bloating. If you had a lower endoscopy (such as a colonoscopy or flexible sigmoidoscopy) you may notice spotting of blood in your stool or on the toilet paper. If you underwent a bowel prep for your procedure, then you may not have a normal bowel movement for a few days.  DIET: Your first meal following the procedure should be a light meal and then it is ok to progress to your normal diet.  A half-sandwich or bowl of soup is an example of a good first meal.  Heavy or fried foods are harder to digest and may make you feel nauseous or bloated.  Likewise meals heavy in dairy and vegetables can cause extra gas to form and this can also increase the bloating.  Drink plenty of fluids but you should avoid alcoholic beverages for 24 hours.  ACTIVITY: Your care partner should take you home directly after the procedure.  You should plan to take it easy, moving slowly for the rest of the day.  You can resume normal activity the day after the procedure however you should NOT DRIVE or use heavy machinery for 24 hours (because of the sedation medicines used during the test).    SYMPTOMS TO REPORT IMMEDIATELY: A gastroenterologist can be reached at any hour.  During normal business hours, 8:30 AM to 5:00 PM Monday through Friday,  call [phone].  After hours and on weekends, please call the GI answering service at [phone] who will take a message and have the physician on call contact you.   Following lower endoscopy (colonoscopy or flexible sigmoidoscopy):  Excessive amounts of blood in the stool  Significant tenderness or worsening of abdominal pains  Swelling of the abdomen that is new, acute  Fever of 100F or higher    FOLLOW UP: If any biopsies were taken you will be contacted by phone or by letter within the next 1-3 weeks.  Call your gastroenterologist if you have not heard about the biopsies in 3 weeks.  Our staff will call the home number listed on your records the next business day following your procedure to check on you and address any questions or concerns that you may have at that time regarding the information given to you following your procedure. This is a courtesy call and so if there is no answer at the home number and we have not heard from you through the emergency physician on call, we will assume that you have returned to your regular daily activities without incident.  SIGNATURES/CONFIDENTIALITY: You and/or your care partner have signed paperwork which will be entered into your electronic medical record.  These signatures attest to the fact that that the information above on your After Visit Summary has been reviewed and is understood.  Full responsibility of the confidentiality   of this discharge information lies with you and/or your care-partner.     

## 2014-03-14 NOTE — Progress Notes (Signed)
A/ox3 pleased with MAC, report to Karen RN 

## 2014-03-14 NOTE — Progress Notes (Signed)
Called to room to assist during endoscopic procedure.  Patient ID and intended procedure confirmed with present staff. Received instructions for my participation in the procedure from the performing physician.  

## 2014-03-14 NOTE — Progress Notes (Signed)
CMA FROM GI UP TO INSTRUCT PATIENT ON MRI THAT IS SCHEDULED. WORK NOTE GIVEN AND DOSE PACK SENT TO [facility]. PATIENT TO LAB ON DISCHARGE.

## 2014-03-15 ENCOUNTER — Telehealth: Payer: Self-pay | Admitting: *Deleted

## 2014-03-15 NOTE — Telephone Encounter (Signed)
Tried to reach pt twice both times line was busy, follow-up

## 2014-03-17 ENCOUNTER — Encounter: Payer: Self-pay | Admitting: Internal Medicine

## 2014-03-23 ENCOUNTER — Other Ambulatory Visit: Payer: Self-pay | Admitting: Internal Medicine

## 2014-03-23 ENCOUNTER — Ambulatory Visit (HOSPITAL_COMMUNITY)
Admission: RE | Admit: 2014-03-23 | Discharge: 2014-03-23 | Disposition: A | Discharge status: Home / Self Care | Payer: Medicare Other | Source: Ambulatory Visit | Attending: Internal Medicine | Admitting: Internal Medicine

## 2014-03-23 DIAGNOSIS — M25559 Pain in unspecified hip: Secondary | ICD-10-CM | POA: Diagnosis present

## 2014-03-23 DIAGNOSIS — R1032 Left lower quadrant pain: Secondary | ICD-10-CM | POA: Diagnosis not present

## 2014-03-23 MED ORDER — GADOBENATE DIMEGLUMINE 529 MG/ML IV SOLN
10.0000 mL | Freq: Once | INTRAVENOUS | Status: AC | PRN
  Administered 2014-03-23: 10 mL via INTRAVENOUS

## 2014-03-26 ENCOUNTER — Telehealth: Payer: Self-pay | Admitting: Hematology and Oncology

## 2014-03-26 NOTE — Telephone Encounter (Signed)
Spk w/pt confirming MD/schedule change, mailing out updated sch per pt req.Marland Kitchen...  KJ

## 2014-03-28 ENCOUNTER — Telehealth: Payer: Self-pay | Admitting: Gastroenterology

## 2014-03-28 NOTE — Telephone Encounter (Signed)
I would like to see her in the office first, may need a referral to a orthopedists, but I want to talk to her first. ? Tramadol 50mg , #30 1 po q 6 hrs prn pain.,1 refill.

## 2014-03-28 NOTE — Telephone Encounter (Signed)
Patient's husband calling to report that patient has had MRI, CT and colonoscopy. She is still having pain and wants to know what to do next. Please,advise.

## 2014-03-28 NOTE — Telephone Encounter (Signed)
Left a message for patient to call me. 

## 2014-03-29 NOTE — Telephone Encounter (Signed)
Mr [name] came to bring [place] a Notarized paper so that we can speak to him about his wife. I printed out our form and gave to him so we can have for our records.

## 2014-03-29 NOTE — Telephone Encounter (Signed)
Patient left a message that she can be reached at [phone]. Left a message for patient to call me back.

## 2014-03-29 NOTE — Telephone Encounter (Signed)
Left a message for patient to call back. 

## 2014-03-30 NOTE — Telephone Encounter (Signed)
Left a message for patient to call back. 

## 2014-03-30 NOTE — Telephone Encounter (Signed)
Spoke with patient's husband and scheduled OV on 04/12/14 at 9:00 AM. He states she has pain medication and does not need rx at this time.

## 2014-03-31 ENCOUNTER — Encounter: Payer: Self-pay | Admitting: *Deleted

## 2014-04-12 ENCOUNTER — Encounter: Payer: Self-pay | Admitting: Internal Medicine

## 2014-04-12 ENCOUNTER — Ambulatory Visit (INDEPENDENT_AMBULATORY_CARE_PROVIDER_SITE_OTHER): Payer: Medicare Other | Admitting: Internal Medicine

## 2014-04-12 VITALS — BP 120/76 | HR 60 | Ht 64.0 in | Wt 116.2 lb

## 2014-04-12 DIAGNOSIS — R1032 Left lower quadrant pain: Secondary | ICD-10-CM

## 2014-04-12 NOTE — Patient Instructions (Addendum)
You have been scheduled for an appointment with Dr Georganna Skeans at [facility]. Your appointment is on Wednesday, 04/13/14 at 9:45 am. Please arrive at 9:30 am for registration. Make certain to bring a list of current medications, including any over the counter medications or vitamins. Also bring your co-pay if you have one as well as your insurance cards. [facility] is located at [address]. Should you need to reschedule your appointment, please contact them at [phone].  CC:Dr Marton Redwood, Dr Georganna Skeans

## 2014-04-12 NOTE — Progress Notes (Signed)
[name] 1945/11/24 [id number]  Note: This dictation was prepared with Dragon digital system. Any transcriptional errors that result from this procedure are unintentional.   History of Present Illness:  This is a 68 year old white female with persistent intermittent left lower quadrant abdominal pain which is relieved by laying down and becomes very severe when she stands up. We have done multiple imaging studies of her abdomen but have not been able to find any reason for the pain. The pain predictably starts off when she stands up in the morning or if she has been standing for a while  If she lays on the left side for few minutes, the pain goes away. It is also made worse by lying on her right side. She had a total abdominal hysterectomy  at age 3, and BSO in  15. We did a colonoscopy last month which showed mild melanosis coli and a tubular adenoma. She had an MRI to look for  LS f radiculopathy and rule out  left hip osteoarthritis. There was nothing found to account for her pain. The pain is not associated with eating or with elimintation. Taking laxatives does not seem to relieve it. She saw a Psychologist, sport and exercise several years ago for a second opinion but not enough pathology was found to justify a surgical procedure. She says now that the pain is much worse and that she is unable to work when she has it.Her husband confirms that.    Past Medical History  Diagnosis Date  . GERD (gastroesophageal reflux disease)   . Osteoporosis   . Thyroid disease   . Enlarged lymph nodes   . Fibromyalgia   . Cancer     left breast dcis  . PONV (postoperative nausea and vomiting)     after ovaries removed..that's the only time  . POTS (postural orthostatic tachycardia syndrome)     saw Dr  Verlon Setting for this 2-3 yr ago...saw Dr Clearnce Hasten 2 yr ago....no problems ...  . DCIS (ductal carcinoma in situ) of breast   . Hyperlipidemia   . Tarlov cyst     s2 level  . Diverticulosis   . Internal hemorrhoids   .  Tubular adenoma of colon     Past Surgical History  Procedure Laterality Date  . Abdominal hysterectomy  1974  . Bilateral oophorectomy  1999  . Breast biopsy  04/24/11    left breast biopsy   . Breast surgery      bilateral simple mastectomy, left ax snbx  . Salpingectomy  05/1998  . Breast reconstruction  09/2011    Allergies  Allergen Reactions  . Cyclobenzaprine Hcl Palpitations  . Sertraline Hcl Palpitations and Other (See Comments)    Causes visual sensitivity (especially to particular colors)  . Aspartame And Phenylalanine   . Citalopram Hydrobromide Other (See Comments)    Agitated makes her run red lights  . Propoxyphene N-Acetaminophen Other (See Comments)    unknown  . Alendronate Sodium Nausea And Vomiting  . Atarax [Hydroxyzine Hcl] Palpitations  . Naproxen Hives, Diarrhea and Nausea And Vomiting    Family history and social history have been reviewed.  Review of Systems: Denies weight loss depression fever rectal bleeding  The remainder of the 10 point ROS is negative except as outlined in the H&P  Physical Exam: General Appearance thin, in no distress Eyes  Non icteric  HEENT  Non traumatic, normocephalic  Mouth No lesion, tongue papillated, no cheilosis Neck Supple without adenopathy, thyroid not enlarged, no carotid  bruits, no JVD Lungs Clear to auscultation bilaterally COR Normal S1, normal S2, regular rhythm, no murmur, quiet precordium Abdomen scaphoid abdomen with normoactive bowel sounds. Mild tenderness on deep pressure throughout the left colon without palpable mass. Straight leg raising is negative. Sitting up and laying down does not seem to precipitate the pain. In standing position, I don't see any bulge or hernia but she claims to be asymmetrical on that side. Rectal not done today Extremities  No pedal edema Skin No lesions Neurological Alert and oriented x 3 Psychological Normal mood and affect  Assessment and Plan:   Problem #53  67 year old white female with rather severe intermittent left sided abdominal pain which seems to be severe enough for her to be incapacitated at times. It is clearly related to position and is definitely worse when she stands up. It is always  relieved by lying down, specifically on the left side. I feel that there is some anatomic problem precipitating this condition and that the imaging studies are not able to see that. She may have an internal hernia or adhesions. I will refer her for a surgical opinion for consideration of a laparoscopic look in the left lower quadrant. She is not interested in taking pain medication . She does not seem to be depressed.    Delfin Edis 04/12/2014

## 2014-04-20 ENCOUNTER — Telehealth: Payer: Self-pay | Admitting: Internal Medicine

## 2014-04-20 NOTE — Telephone Encounter (Signed)
I understand, but I cannot tell Dr Grandville Silos to operate. She may get another opinion  From her gynecologist to see if she had any suggestions.

## 2014-04-20 NOTE — Telephone Encounter (Signed)
Patient's husband calling to let Dr. Olevia Perches know that patient saw surgeon, Dr. Grandville Silos and was told she does not need laparoscopy. He states she is still hurting. He also wants to let Dr.Brodie know that in 1999, patient had some surgery and was told she had endometriosis.

## 2014-04-21 NOTE — Telephone Encounter (Signed)
Patient given recommendation. She is seeing her GYN on 05/30/14.

## 2014-07-27 ENCOUNTER — Other Ambulatory Visit: Payer: Self-pay | Admitting: *Deleted

## 2014-07-27 DIAGNOSIS — D0510 Intraductal carcinoma in situ of unspecified breast: Secondary | ICD-10-CM

## 2014-07-28 ENCOUNTER — Telehealth: Payer: Self-pay | Admitting: Hematology and Oncology

## 2014-07-28 ENCOUNTER — Ambulatory Visit (HOSPITAL_BASED_OUTPATIENT_CLINIC_OR_DEPARTMENT_OTHER): Payer: Medicare Other | Admitting: Hematology and Oncology

## 2014-07-28 ENCOUNTER — Other Ambulatory Visit (HOSPITAL_BASED_OUTPATIENT_CLINIC_OR_DEPARTMENT_OTHER): Payer: Medicare Other

## 2014-07-28 VITALS — BP 152/90 | HR 64 | Temp 97.6°F | Resp 18 | Ht 64.0 in | Wt 118.6 lb

## 2014-07-28 DIAGNOSIS — D0512 Intraductal carcinoma in situ of left breast: Secondary | ICD-10-CM

## 2014-07-28 DIAGNOSIS — D0510 Intraductal carcinoma in situ of unspecified breast: Secondary | ICD-10-CM

## 2014-07-28 DIAGNOSIS — D0511 Intraductal carcinoma in situ of right breast: Secondary | ICD-10-CM

## 2014-07-28 LAB — CBC WITH DIFFERENTIAL/PLATELET
BASO%: 0.8 % (ref 0.0–2.0)
Basophils Absolute: 0 10*3/uL (ref 0.0–0.1)
EOS%: 4.9 % (ref 0.0–7.0)
Eosinophils Absolute: 0.2 10*3/uL (ref 0.0–0.5)
HCT: 42.7 % (ref 34.8–46.6)
HGB: 13.7 g/dL (ref 11.6–15.9)
LYMPH#: 1.4 10*3/uL (ref 0.9–3.3)
LYMPH%: 30.5 % (ref 14.0–49.7)
MCH: 30.2 pg (ref 25.1–34.0)
MCHC: 32 g/dL (ref 31.5–36.0)
MCV: 94.2 fL (ref 79.5–101.0)
MONO#: 0.6 10*3/uL (ref 0.1–0.9)
MONO%: 12.9 % (ref 0.0–14.0)
NEUT#: 2.3 10*3/uL (ref 1.5–6.5)
NEUT%: 50.9 % (ref 38.4–76.8)
Platelets: 244 10*3/uL (ref 145–400)
RBC: 4.53 10*6/uL (ref 3.70–5.45)
RDW: 13.1 % (ref 11.2–14.5)
WBC: 4.6 10*3/uL (ref 3.9–10.3)

## 2014-07-28 LAB — COMPREHENSIVE METABOLIC PANEL (CC13)
ALK PHOS: 64 U/L (ref 40–150)
ALT: 18 U/L (ref 0–55)
AST: 26 U/L (ref 5–34)
Albumin: 3.9 g/dL (ref 3.5–5.0)
Anion Gap: 10 mEq/L (ref 3–11)
BILIRUBIN TOTAL: 0.67 mg/dL (ref 0.20–1.20)
BUN: 10.3 mg/dL (ref 7.0–26.0)
CALCIUM: 9.4 mg/dL (ref 8.4–10.4)
CO2: 27 meq/L (ref 22–29)
Chloride: 107 mEq/L (ref 98–109)
Creatinine: 0.8 mg/dL (ref 0.6–1.1)
EGFR: 72 mL/min/{1.73_m2} — ABNORMAL LOW (ref >90)
GLUCOSE: 95 mg/dL (ref 70–140)
Potassium: 4.5 mEq/L (ref 3.5–5.1)
SODIUM: 144 meq/L (ref 136–145)
TOTAL PROTEIN: 6.8 g/dL (ref 6.4–8.3)

## 2014-07-28 MED ORDER — ZOLPIDEM TARTRATE 10 MG PO TABS
10.0000 mg | ORAL_TABLET | Freq: Every evening | ORAL | Status: AC | PRN
Start: 2014-07-28 — End: ?

## 2014-07-28 NOTE — Telephone Encounter (Signed)
per pof to sch pt appt-gave pt copy of sch °

## 2014-07-28 NOTE — Assessment & Plan Note (Signed)
Left breast DCIS: ER 0% PR 0% initially she had lumpectomy with positive margins subsequently decided to do bilateral mastectomies which did not show any further evidence of DCIS, currently on observation.  Breast cancer surveillance: 1. Examination of axilla and chest wall did not reveal any abnormalities 2. No role for imaging studies because she had bilateral mastectomies  Survivorship:Discussed the importance of physical exercise in decreasing the likelihood of breast cancer recurrence. Recommended 30 mins daily 6 days a week of either brisk walking or cycling or swimming. Encouraged patient to eat more fruits and vegetables and decrease red meat.   Return to clinic annually for physical exams and follow-up.

## 2014-07-28 NOTE — Progress Notes (Signed)
Patient Care Team: Marton Redwood, MD as PCP - General (Internal Medicine) Allena Katz, MD as Consulting Physician (Obstetrics and Gynecology)  DIAGNOSIS: No matching staging information was found for the patient.  SUMMARY OF ONCOLOGIC HISTORY:   Neoplasm of right breast, primary tumor staging category Tis: ductal carcinoma in situ (DCIS)   04/24/2011 Surgery Left breast lumpectomy: DCIS high-grade 0.6 cm, broadly present at the deep margin, ER 0%, PR 0%   06/20/2011 Surgery Right breast mastectomy: Benign; left breast mastectomy: Benign, 3 sentinel nodes negative    CHIEF COMPLIANT:  Follow-up of DCIS  INTERVAL HISTORY: [name] is a 69 year old with above-mentioned history of right-sided breast cancer treated with lumpectomy initially followed by mastectomy. Because she is ER/PR negative there was no role of antiestrogen therapy. She is being followed annually with physical exam and follow-up. She had bilateral mastectomies and reconstruction. She is not happy with her reconstruction as it causes tenderness all the time. She is going to meet with the plastic surgeon to discuss removal of the implants versus helping her with the symptoms.  REVIEW OF SYSTEMS:   Constitutional: Denies fevers, chills or abnormal weight loss Eyes: Denies blurriness of vision Ears, nose, mouth, throat, and face: Denies mucositis or sore throat Respiratory: Denies cough, dyspnea or wheezes Cardiovascular: Denies palpitation, chest discomfort or lower extremity swelling Gastrointestinal:  Denies nausea, heartburn or change in bowel habits Skin: Denies abnormal skin rashes Lymphatics: Denies new lymphadenopathy or easy bruising Neurological:Denies numbness, tingling or new weaknesses Behavioral/Psych: Mood is stable, no new changes  Breast: Pain around the implants All other systems were reviewed with the patient and are negative.  I have reviewed the past medical history, past surgical history,  social history and family history with the patient and they are unchanged from previous note.  ALLERGIES:  is allergic to cyclobenzaprine hcl; sertraline hcl; aspartame and phenylalanine; citalopram hydrobromide; propoxyphene n-acetaminophen; alendronate sodium; atarax; and naproxen.  MEDICATIONS:  Current Outpatient Prescriptions  Medication Sig Dispense Refill  . cholecalciferol (VITAMIN D) 1000 UNITS tablet Take 1,000 Units by mouth daily.    Marland Kitchen levothyroxine (SYNTHROID, LEVOTHROID) 100 MCG tablet Take 100 mcg by mouth daily before breakfast.    . LORazepam (ATIVAN) 0.5 MG tablet Take 1 tablet (0.5 mg total) by mouth every 8 (eight) hours. 30 tablet 0  . UNABLE TO FIND Rx: [id number]- Post Mastectomy Camisole (Quantity: 2) L8000- Post Surgical Bras (Quantity: 6) Dx: 174.9; Bilateral mastectomy 1 each 0  . UNABLE TO FIND Rx: [id number]- Silicone Breast Prosthesis (Quantity: 2) Dx: 174.9; Bilateral mastectomy 1 each 0  . UNABLE TO FIND Rx: L8000- Mastectomy Bra (Quantity: 7) [id number]- Post Mastectomy Garment (Quantity: 3) Dx: 174.9, Bilateral Mastectomy 1 each 0  . zolpidem (AMBIEN) 10 MG tablet Take 1 tablet (10 mg total) by mouth at bedtime as needed. For sleep 30 tablet 3   No current facility-administered medications for this visit.    PHYSICAL EXAMINATION: ECOG PERFORMANCE STATUS: 1 - Symptomatic but completely ambulatory  Filed Vitals:   07/28/14 0906  BP: 152/90  Pulse: 64  Temp: 97.6 F (36.4 C)  Resp: 18   Filed Weights   07/28/14 0906  Weight: 118 lb 9.6 oz (53.797 kg)    GENERAL:alert, no distress and comfortable SKIN: skin color, texture, turgor are normal, no rashes or significant lesions EYES: normal, Conjunctiva are pink and non-injected, sclera clear OROPHARYNX:no exudate, no erythema and lips, buccal mucosa, and tongue normal  NECK: supple, thyroid normal size, non-tender,  without nodularity LYMPH:  no palpable lymphadenopathy in the cervical, axillary or  inguinal LUNGS: clear to auscultation and percussion with normal breathing effort HEART: regular rate & rhythm and no murmurs and no lower extremity edema ABDOMEN:abdomen soft, non-tender and normal bowel sounds Musculoskeletal:no cyanosis of digits and no clubbing  NEURO: alert & oriented x 3 with fluent speech, no focal motor/sensory deficits BREAST no palpable lymph nodes or masses around the implants. (exam performed in the presence of a chaperone)  LABORATORY DATA:  I have reviewed the data as listed   Chemistry      Component Value Date/Time   NA 144 07/28/2014 0850   NA 141 07/25/2011 1430   K 4.5 07/28/2014 0850   K 3.9 07/25/2011 1430   CL 104 07/25/2011 1430   CO2 27 07/28/2014 0850   CO2 29 07/25/2011 1430   BUN 10.3 07/28/2014 0850   BUN 4* 03/14/2014 1035   CREATININE 0.8 07/28/2014 0850   CREATININE 0.8 03/14/2014 1035   CREATININE 0.90 05/03/2013 0817      Component Value Date/Time   CALCIUM 9.4 07/28/2014 0850   CALCIUM 9.7 07/25/2011 1430   ALKPHOS 64 07/28/2014 0850   ALKPHOS 61 05/25/2013 0937   AST 26 07/28/2014 0850   AST 27 05/25/2013 0937   ALT 18 07/28/2014 0850   ALT 15 05/25/2013 0937   BILITOT 0.67 07/28/2014 0850   BILITOT 0.5 05/25/2013 0937       Lab Results  Component Value Date   WBC 4.6 07/28/2014   HGB 13.7 07/28/2014   HCT 42.7 07/28/2014   MCV 94.2 07/28/2014   PLT 244 07/28/2014   NEUTROABS 2.3 07/28/2014   ASSESSMENT & PLAN:  Neoplasm of right breast, primary tumor staging category Tis: ductal carcinoma in situ (DCIS) Left breast DCIS: ER 0% PR 0% initially she had lumpectomy with positive margins subsequently decided to do bilateral mastectomies which did not show any further evidence of DCIS, currently on observation.  Breast cancer surveillance: 1. Examination of axilla and chest wall did not reveal any abnormalities 2. No role for imaging studies because she had bilateral mastectomies  Survivorship:Discussed the  importance of physical exercise in decreasing the likelihood of breast cancer recurrence. Recommended 30 mins daily 6 days a week of either brisk walking or cycling or swimming. Encouraged patient to eat more fruits and vegetables and decrease red meat.   Return to clinic annually for physical exams and follow-up.     Orders Placed This Encounter  Procedures  . CBC with Differential    Standing Status: Future     Number of Occurrences:      Standing Expiration Date: 07/28/2015  . Comprehensive metabolic panel (Cmet) - [facility]    Standing Status: Future     Number of Occurrences:      Standing Expiration Date: 07/28/2015   The patient has a good understanding of the overall plan. she agrees with it. She will call with any problems that may develop before her next visit here.   Rulon Eisenmenger, MD

## 2015-07-28 ENCOUNTER — Other Ambulatory Visit (HOSPITAL_BASED_OUTPATIENT_CLINIC_OR_DEPARTMENT_OTHER): Payer: Medicare Other

## 2015-07-28 ENCOUNTER — Ambulatory Visit (HOSPITAL_BASED_OUTPATIENT_CLINIC_OR_DEPARTMENT_OTHER): Payer: Medicare Other | Admitting: Hematology and Oncology

## 2015-07-28 ENCOUNTER — Encounter: Payer: Self-pay | Admitting: Hematology and Oncology

## 2015-07-28 VITALS — BP 139/79 | HR 69 | Temp 98.1°F | Resp 18 | Ht 64.0 in | Wt 132.8 lb

## 2015-07-28 DIAGNOSIS — D0511 Intraductal carcinoma in situ of right breast: Secondary | ICD-10-CM

## 2015-07-28 DIAGNOSIS — D0512 Intraductal carcinoma in situ of left breast: Secondary | ICD-10-CM

## 2015-07-28 LAB — COMPREHENSIVE METABOLIC PANEL
ALBUMIN: 3.6 g/dL (ref 3.5–5.0)
ALK PHOS: 78 U/L (ref 40–150)
ALT: 12 U/L (ref 0–55)
AST: 18 U/L (ref 5–34)
Anion Gap: 8 mEq/L (ref 3–11)
BUN: 9.4 mg/dL (ref 7.0–26.0)
CO2: 27 meq/L (ref 22–29)
Calcium: 9.2 mg/dL (ref 8.4–10.4)
Chloride: 111 mEq/L — ABNORMAL HIGH (ref 98–109)
Creatinine: 0.8 mg/dL (ref 0.6–1.1)
EGFR: 77 mL/min/{1.73_m2} — ABNORMAL LOW (ref >90)
Glucose: 78 mg/dl (ref 70–140)
Potassium: 3.9 mEq/L (ref 3.5–5.1)
Sodium: 146 mEq/L — ABNORMAL HIGH (ref 136–145)
TOTAL PROTEIN: 6.6 g/dL (ref 6.4–8.3)
Total Bilirubin: 0.33 mg/dL (ref 0.20–1.20)

## 2015-07-28 LAB — CBC WITH DIFFERENTIAL/PLATELET
BASO%: 0.2 % (ref 0.0–2.0)
Basophils Absolute: 0 10*3/uL (ref 0.0–0.1)
EOS%: 5.1 % (ref 0.0–7.0)
Eosinophils Absolute: 0.2 10*3/uL (ref 0.0–0.5)
HCT: 42.9 % (ref 34.8–46.6)
HGB: 13.7 g/dL (ref 11.6–15.9)
LYMPH%: 28.2 % (ref 14.0–49.7)
MCH: 29.7 pg (ref 25.1–34.0)
MCHC: 31.9 g/dL (ref 31.5–36.0)
MCV: 93.1 fL (ref 79.5–101.0)
MONO#: 0.6 10*3/uL (ref 0.1–0.9)
MONO%: 11.8 % (ref 0.0–14.0)
NEUT%: 54.7 % (ref 38.4–76.8)
NEUTROS ABS: 2.6 10*3/uL (ref 1.5–6.5)
PLATELETS: 249 10*3/uL (ref 145–400)
RBC: 4.61 10*6/uL (ref 3.70–5.45)
RDW: 13.7 % (ref 11.2–14.5)
WBC: 4.7 10*3/uL (ref 3.9–10.3)
lymph#: 1.3 10*3/uL (ref 0.9–3.3)

## 2015-07-28 NOTE — Progress Notes (Signed)
Patient Care Team: Marton Redwood, MD as PCP - General (Internal Medicine) Everlene Farrier, MD as Consulting Physician (Obstetrics and Gynecology)  DIAGNOSIS: No matching staging information was found for the patient.  SUMMARY OF ONCOLOGIC HISTORY:   Neoplasm of right breast, primary tumor staging category Tis: ductal carcinoma in situ (DCIS)   04/24/2011 Surgery Left breast lumpectomy: DCIS high-grade 0.6 cm, broadly present at the deep margin, ER 0%, PR 0%   06/20/2011 Surgery Right breast mastectomy: Benign; left breast mastectomy: Benign, 3 sentinel nodes negative    CHIEF COMPLIANT: follow-up of DCIS  INTERVAL HISTORY: [name] is a 23 year with above-mentioned history of DCIS underwent bilateral mastectomies and is here for annual surveillance. She reports no new problems or concerns with her breasts. Her major problems have been fatigue and hoarseness otherwise for which she is undergoing extensive testing and investigations. I do not have any results of the tests in our computer system. There is suspicion that she may have lupus.  REVIEW OF SYSTEMS:   Constitutional: Denies fevers, chills or abnormal weight loss, complains of fatigue Eyes: Denies blurriness of vision Ears, nose, mouth, throat, and face: hoarseness of voice Respiratory: Denies cough, dyspnea or wheezes Cardiovascular: Denies palpitation, chest discomfort Gastrointestinal:  Denies nausea, heartburn or change in bowel habits Skin: Denies abnormal skin rashes Lymphatics: Denies new lymphadenopathy or easy bruising Neurological:Denies numbness, tingling or new weaknesses Behavioral/Psych: Mood is stable, no new changes  Extremities: No lower extremity edema Breast:  denies any pain or lumps or nodules in either breasts All other systems were reviewed with the patient and are negative.  I have reviewed the past medical history, past surgical history, social history and family history with the patient and  they are unchanged from previous note.  ALLERGIES:  is allergic to amitriptyline; cyclobenzaprine hcl; sertraline hcl; yellow dye; aspartame and phenylalanine; citalopram hydrobromide; propoxyphene n-acetaminophen; alendronate sodium; atarax; and naproxen.  MEDICATIONS:  Current Outpatient Prescriptions  Medication Sig Dispense Refill  . cholecalciferol (VITAMIN D) 1000 UNITS tablet Take 1,000 Units by mouth daily.    Marland Kitchen LORazepam (ATIVAN) 0.5 MG tablet Take 1 tablet (0.5 mg total) by mouth every 8 (eight) hours. 30 tablet 0  . nadolol (CORGARD) 20 MG tablet Take by mouth.    . SYNTHROID 75 MCG tablet     . zolpidem (AMBIEN) 10 MG tablet Take 1 tablet (10 mg total) by mouth at bedtime as needed. For sleep 30 tablet 3   No current facility-administered medications for this visit.    PHYSICAL EXAMINATION: ECOG PERFORMANCE STATUS: 1 - Symptomatic but completely ambulatory  Filed Vitals:   07/28/15 0914  BP: 139/79  Pulse: 69  Temp: 98.1 F (36.7 C)  Resp: 18   Filed Weights   07/28/15 0914  Weight: 132 lb 12.8 oz (60.238 kg)    GENERAL:alert, no distress and comfortable SKIN: skin color, texture, turgor are normal, no rashes or significant lesions EYES: normal, Conjunctiva are pink and non-injected, sclera clear OROPHARYNX:no exudate, no erythema and lips, buccal mucosa, and tongue normal  NECK: supple, thyroid normal size, non-tender, without nodularity LYMPH:  no palpable lymphadenopathy in the cervical, axillary or inguinal LUNGS: clear to auscultation and percussion with normal breathing effort HEART: regular rate & rhythm and no murmurs and no lower extremity edema ABDOMEN:abdomen soft, non-tender and normal bowel sounds MUSCULOSKELETAL:no cyanosis of digits and no clubbing  NEURO: alert & oriented x 3 with fluent speech, no focal motor/sensory deficits EXTREMITIES: No lower extremity  edema BREAST:bilateral breast reconstructions no palpable lumps or nodules maxilla are  around the implant.. (exam performed in the presence of a chaperone)  LABORATORY DATA:  I have reviewed the data as listed   Chemistry      Component Value Date/Time   NA 146* 07/28/2015 0829   NA 141 07/25/2011 1430   K 3.9 07/28/2015 0829   K 3.9 07/25/2011 1430   CL 104 07/25/2011 1430   CO2 27 07/28/2015 0829   CO2 29 07/25/2011 1430   BUN 9.4 07/28/2015 0829   BUN 4* 03/14/2014 1035   CREATININE 0.8 07/28/2015 0829   CREATININE 0.8 03/14/2014 1035   CREATININE 0.90 05/03/2013 0817      Component Value Date/Time   CALCIUM 9.2 07/28/2015 0829   CALCIUM 9.7 07/25/2011 1430   ALKPHOS 78 07/28/2015 0829   ALKPHOS 61 05/25/2013 0937   AST 18 07/28/2015 0829   AST 27 05/25/2013 0937   ALT 12 07/28/2015 0829   ALT 15 05/25/2013 0937   BILITOT 0.33 07/28/2015 0829   BILITOT 0.5 05/25/2013 0937       Lab Results  Component Value Date   WBC 4.7 07/28/2015   HGB 13.7 07/28/2015   HCT 42.9 07/28/2015   MCV 93.1 07/28/2015   PLT 249 07/28/2015   NEUTROABS 2.6 07/28/2015     ASSESSMENT & PLAN:  Neoplasm of right breast, primary tumor staging category Tis: ductal carcinoma in situ (DCIS) Left breast DCIS: ER 0% PR 0% initially she had lumpectomy on 04/24/11 with positive margins subsequently decided to do bilateral mastectomies on 06/20/11 which did not show any further evidence of DCIS, currently on observation.  Breast cancer surveillance: 1. Examination of axilla and chest wall did not reveal any abnormalities 2. No role for imaging studies because she had bilateral mastectomies  Survivorship:Discussed the importance of physical exercise in decreasing the likelihood of breast cancer recurrence. Recommended 30 mins daily 6 days a week of either brisk walking or cycling or swimming. Encouraged patient to eat more fruits and vegetables and decrease red meat.   I discussed with the patient that since she has been doing quite well from breast cancer standpoint, she probably  does not need to come back to see [place] unless there are any problems or concerns.   No orders of the defined types were placed in this encounter.   The patient has a good understanding of the overall plan. she agrees with it. she will call with any problems that may develop before the next visit here.   Rulon Eisenmenger, MD 07/28/2015

## 2015-07-28 NOTE — Assessment & Plan Note (Signed)
Left breast DCIS: ER 0% PR 0% initially she had lumpectomy on 04/24/11 with positive margins subsequently decided to do bilateral mastectomies on 06/20/11 which did not show any further evidence of DCIS, currently on observation.  Breast cancer surveillance: 1. Examination of axilla and chest wall did not reveal any abnormalities 2. No role for imaging studies because she had bilateral mastectomies  Survivorship:Discussed the importance of physical exercise in decreasing the likelihood of breast cancer recurrence. Recommended 30 mins daily 6 days a week of either brisk walking or cycling or swimming. Encouraged patient to eat more fruits and vegetables and decrease red meat.   Return to clinic annually for physical exams and follow-up with survivorship clinic.

## 2015-07-28 NOTE — Progress Notes (Signed)
Pt reports some anxiety, requesting refill of ativan.  Reports change PCP to [facility] - constant fatigue, muscle and bones aching, hoarseness.  Dr. Linna Hoff is following.  Medical records notified to update PCP in chart.

## 2015-09-28 IMAGING — US US ABDOMEN COMPLETE
1 series · 13 of 25 positions shown · non-contrast
Comparison: Abdominal ultrasound 10/04/2008.

CLINICAL DATA: Abdominal pain radiating to both sides.

EXAM:
ULTRASOUND ABDOMEN COMPLETE

[Series 1: us abdomen complete · 0.18mm/px · 13 of 81 slices shown]
[im 1/81]
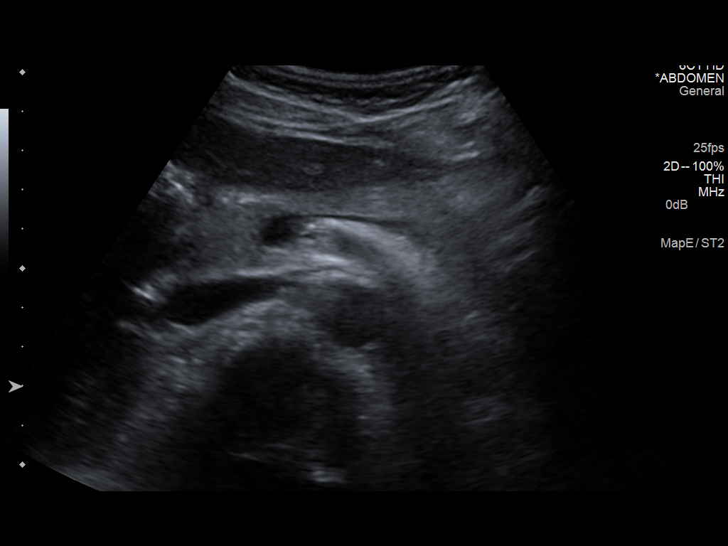
[im 7/81]
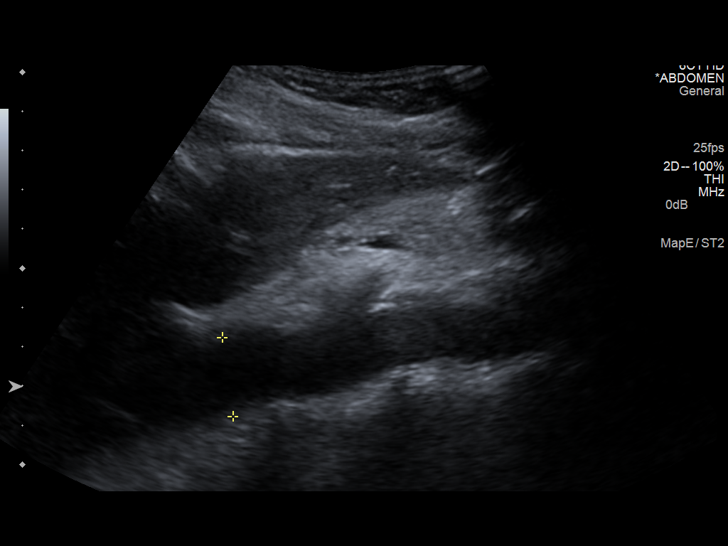
[im 14/81]
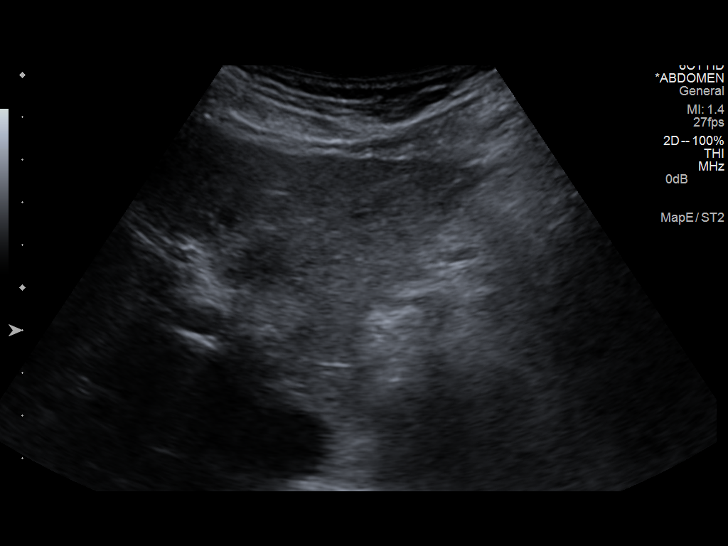
[im 21/81]
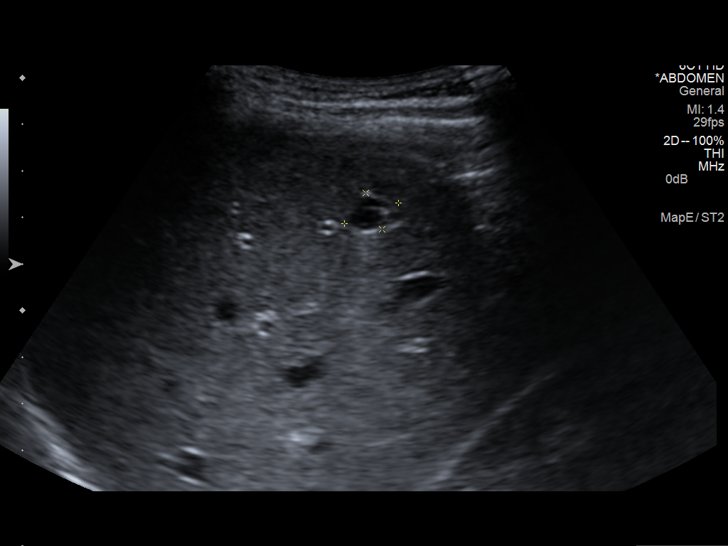
[im 27/81]
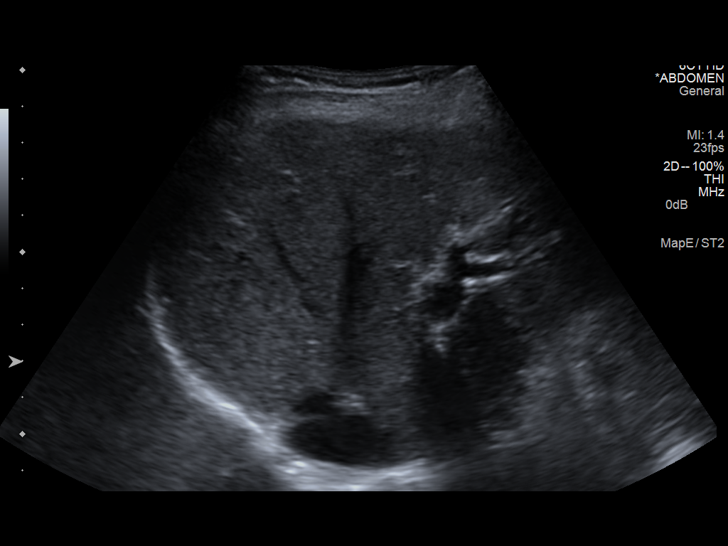
[im 34/81]
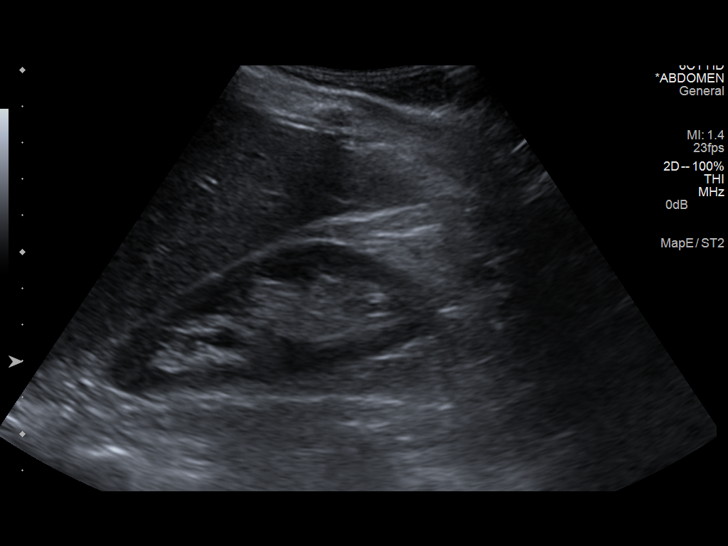
[im 41/81]
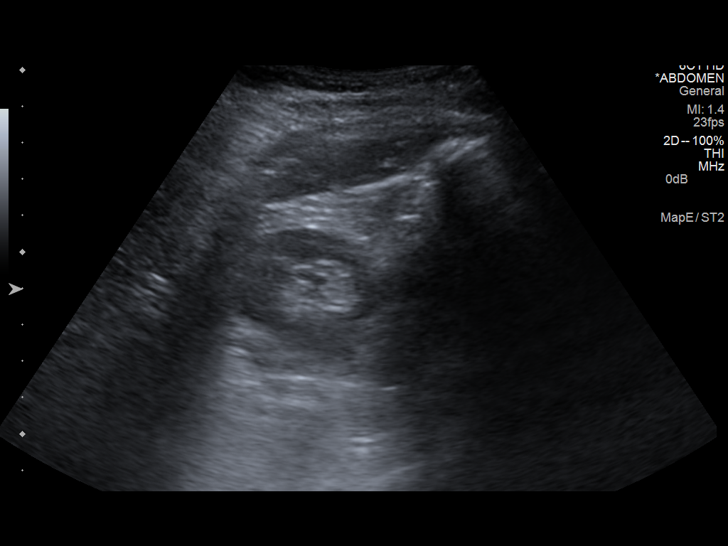
[im 47/81]
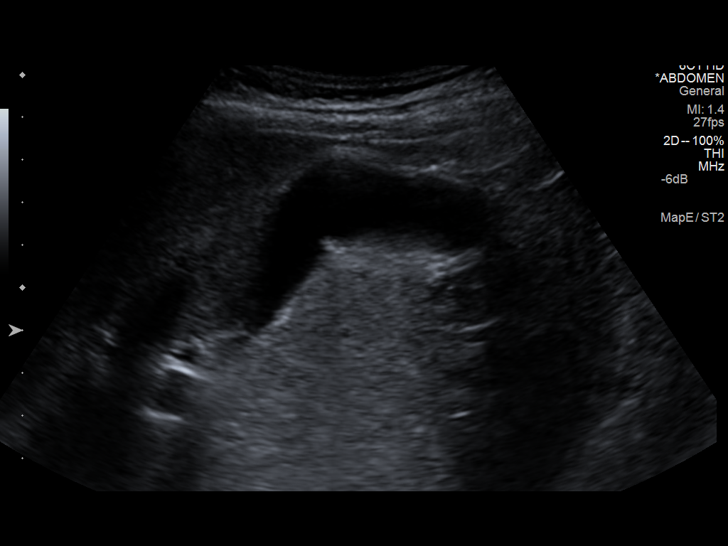
[im 54/81]
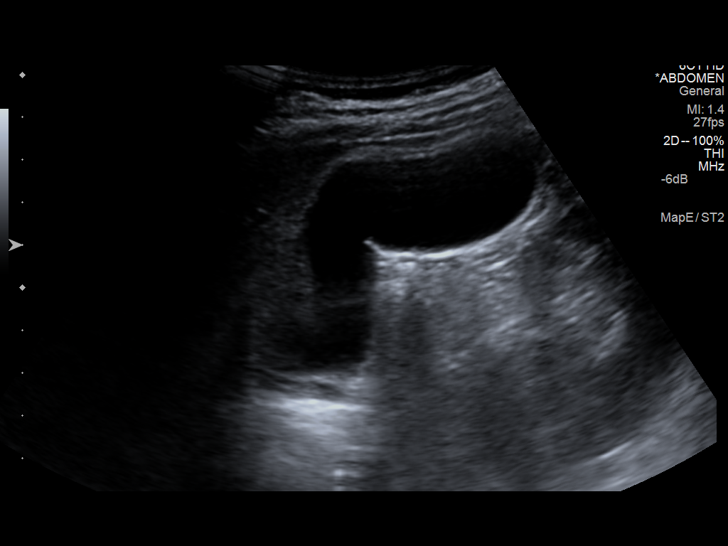
[im 61/81]
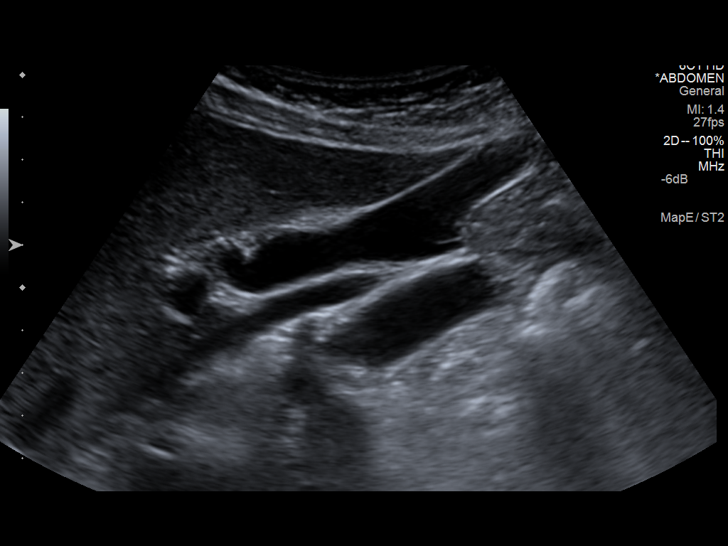
[im 67/81]
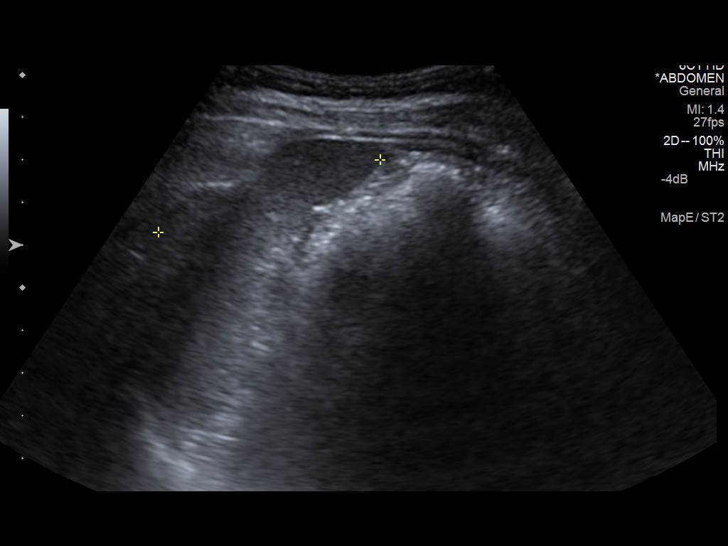
[im 74/81]
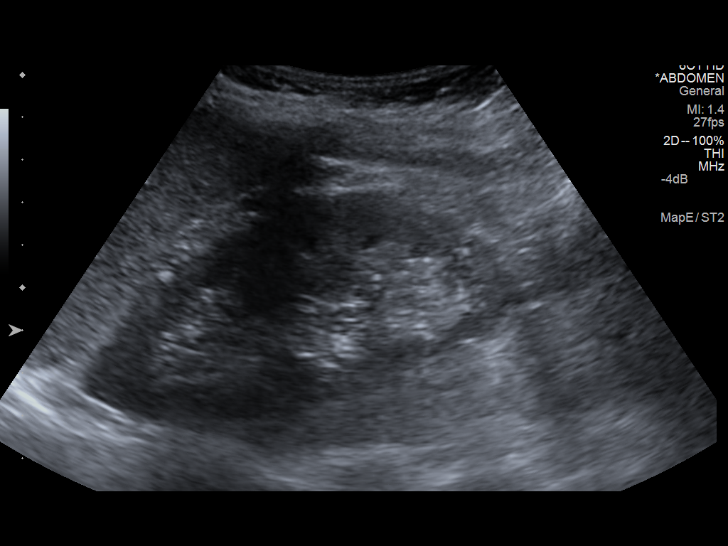
[im 81/81]
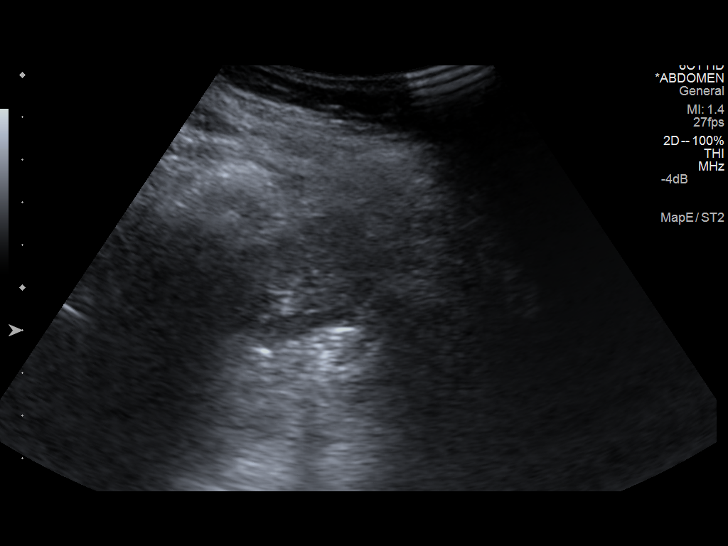

[13 of 25 positions shown; findings below may reference images not displayed]

FINDINGS: Gallbladder

No gallstones or wall thickening visualized. No sonographic Murphy
sign noted.

Common bile duct

Diameter: 6.1 mm.

Liver

As with the prior examination, there is a small 1.2 x 0.9 x 0.9 cm
anechoic lesion with internal septation and increase through
transmission, compatible with a minimally complex cyst in the right
lobe of the liver. Hepatic parenchyma is otherwise normal in
echotexture, without new lesions identified. No intrahepatic biliary
ductal dilatation. Portal triads are mildly echogenic. Normal
hepatopetal flow within the portal vein.

IVC

No abnormality visualized.

Pancreas

Visualized portion unremarkable.

Spleen

Size and appearance within normal limits.  5.5 cm in length.

Right Kidney

Length: 9.4 cm in length. Echogenicity within normal limits. No mass
or frank hydronephrosis visualized. Mild fullness in the upper pole
collecting system is noted.

Left Kidney

Length: 10.0 cm in length. Echogenicity within normal limits. No
mass or hydronephrosis visualized.

Abdominal aorta

No aneurysm visualized.
IMPRESSION: 1. Mildly echogenic portal triads. This is nonspecific, but can be
seen in the setting of hepatitis. Correlation with liver function
tests is suggested.
2. There is some very mild fullness of the upper pole collecting
system in the right kidney, which is of uncertain etiology and
significance. No definite calculi are identified at this time.
However, if there is clinical concern for on recognized calculus or
potential upper tract urothelial neoplasm that could be causing this
finding, further evaluation with hematuria protocol CT scan may be
useful to better evaluate for both of those potential etiologies.
3. Minimally complex cyst in the right lobe of the liver is
unchanged, as above.

## 2017-06-27 ENCOUNTER — Other Ambulatory Visit: Payer: Self-pay

## 2019-02-20 ENCOUNTER — Encounter: Payer: Self-pay | Admitting: Gastroenterology
